# Patient Record
Sex: Female | Born: 1960 | Race: White | Hispanic: No | Marital: Married | Smoking: Never smoker
Health system: Southern US, Community
[De-identification: ages and names within clinical notes are randomized; demographics above are authoritative.]

## PROBLEM LIST (undated history)

## (undated) DIAGNOSIS — E782 Mixed hyperlipidemia: Secondary | ICD-10-CM

## (undated) DIAGNOSIS — I2510 Atherosclerotic heart disease of native coronary artery without angina pectoris: Secondary | ICD-10-CM

## (undated) DIAGNOSIS — R002 Palpitations: Secondary | ICD-10-CM

## (undated) DIAGNOSIS — G43909 Migraine, unspecified, not intractable, without status migrainosus: Secondary | ICD-10-CM

## (undated) DIAGNOSIS — I10 Essential (primary) hypertension: Secondary | ICD-10-CM

## (undated) DIAGNOSIS — M1990 Unspecified osteoarthritis, unspecified site: Secondary | ICD-10-CM

## (undated) DIAGNOSIS — K219 Gastro-esophageal reflux disease without esophagitis: Secondary | ICD-10-CM

## (undated) HISTORY — DX: Gastro-esophageal reflux disease without esophagitis: K21.9

## (undated) HISTORY — PX: GANGLION CYST EXCISION: SHX1691

## (undated) HISTORY — DX: Essential (primary) hypertension: I10

## (undated) HISTORY — PX: RECTOCELE REPAIR: SHX761

## (undated) HISTORY — DX: Palpitations: R00.2

## (undated) HISTORY — DX: Unspecified osteoarthritis, unspecified site: M19.90

## (undated) HISTORY — DX: Mixed hyperlipidemia: E78.2

---

## 1982-10-15 HISTORY — PX: DILATION AND CURETTAGE OF UTERUS: SHX78

## 1989-06-15 HISTORY — PX: CARPAL TUNNEL RELEASE: SHX101

## 1999-05-16 HISTORY — PX: TUBAL LIGATION: SHX77

## 2004-06-13 ENCOUNTER — Emergency Department (HOSPITAL_COMMUNITY): Admission: EM | Admit: 2004-06-13 | Discharge: 2004-06-13 | Payer: Self-pay | Admitting: Emergency Medicine

## 2004-11-21 ENCOUNTER — Emergency Department (HOSPITAL_COMMUNITY): Admission: EM | Admit: 2004-11-21 | Discharge: 2004-11-21 | Payer: Self-pay | Admitting: Emergency Medicine

## 2004-11-27 ENCOUNTER — Ambulatory Visit (HOSPITAL_COMMUNITY): Admission: RE | Admit: 2004-11-27 | Discharge: 2004-11-27 | Payer: Self-pay | Admitting: Emergency Medicine

## 2004-11-29 ENCOUNTER — Ambulatory Visit: Payer: Self-pay | Admitting: *Deleted

## 2007-03-26 ENCOUNTER — Other Ambulatory Visit: Admission: RE | Admit: 2007-03-26 | Discharge: 2007-03-26 | Payer: Self-pay | Admitting: Internal Medicine

## 2007-03-26 ENCOUNTER — Encounter (INDEPENDENT_AMBULATORY_CARE_PROVIDER_SITE_OTHER): Payer: Self-pay | Admitting: Internal Medicine

## 2007-03-27 ENCOUNTER — Ambulatory Visit (HOSPITAL_COMMUNITY): Admission: RE | Admit: 2007-03-27 | Discharge: 2007-03-27 | Payer: Self-pay | Admitting: Internal Medicine

## 2008-01-13 ENCOUNTER — Ambulatory Visit (HOSPITAL_COMMUNITY): Admission: RE | Admit: 2008-01-13 | Discharge: 2008-01-13 | Payer: Self-pay | Admitting: Internal Medicine

## 2008-03-23 ENCOUNTER — Ambulatory Visit (HOSPITAL_COMMUNITY): Admission: RE | Admit: 2008-03-23 | Discharge: 2008-03-23 | Payer: Self-pay | Admitting: Internal Medicine

## 2008-04-26 ENCOUNTER — Other Ambulatory Visit: Admission: RE | Admit: 2008-04-26 | Discharge: 2008-04-26 | Payer: Self-pay | Admitting: Obstetrics and Gynecology

## 2008-05-05 ENCOUNTER — Ambulatory Visit (HOSPITAL_COMMUNITY): Admission: RE | Admit: 2008-05-05 | Discharge: 2008-05-05 | Payer: Self-pay | Admitting: Internal Medicine

## 2008-05-05 ENCOUNTER — Encounter: Admission: RE | Admit: 2008-05-05 | Discharge: 2008-05-05 | Payer: Self-pay | Admitting: Neurosurgery

## 2008-05-17 ENCOUNTER — Ambulatory Visit (HOSPITAL_COMMUNITY): Admission: RE | Admit: 2008-05-17 | Discharge: 2008-05-17 | Payer: Self-pay | Admitting: Internal Medicine

## 2008-05-19 ENCOUNTER — Encounter: Admission: RE | Admit: 2008-05-19 | Discharge: 2008-05-19 | Payer: Self-pay | Admitting: Neurosurgery

## 2008-06-18 ENCOUNTER — Encounter: Admission: RE | Admit: 2008-06-18 | Discharge: 2008-06-18 | Payer: Self-pay | Admitting: Neurosurgery

## 2008-09-13 ENCOUNTER — Observation Stay (HOSPITAL_COMMUNITY): Admission: RE | Admit: 2008-09-13 | Discharge: 2008-09-14 | Payer: Self-pay | Admitting: Obstetrics and Gynecology

## 2008-11-18 ENCOUNTER — Ambulatory Visit (HOSPITAL_COMMUNITY): Admission: RE | Admit: 2008-11-18 | Discharge: 2008-11-18 | Payer: Self-pay | Admitting: Internal Medicine

## 2009-05-26 ENCOUNTER — Ambulatory Visit (HOSPITAL_COMMUNITY): Admission: RE | Admit: 2009-05-26 | Discharge: 2009-05-26 | Payer: Self-pay | Admitting: Internal Medicine

## 2009-10-27 ENCOUNTER — Ambulatory Visit (HOSPITAL_COMMUNITY): Admission: RE | Admit: 2009-10-27 | Discharge: 2009-10-27 | Payer: Self-pay | Admitting: Internal Medicine

## 2009-11-07 ENCOUNTER — Emergency Department (HOSPITAL_COMMUNITY): Admission: EM | Admit: 2009-11-07 | Discharge: 2009-11-07 | Payer: Self-pay | Admitting: Emergency Medicine

## 2010-03-01 IMAGING — CT CT HEAD W/O CM
1 series · 16 of 30 positions shown, 20 images · non-contrast
Comparison: None

CLINICAL DATA: Pain left side of head and left eye

CT HEAD WITHOUT CONTRAST
TECHNIQUE: Contiguous axial images were obtained from the base of
the skull through the vertex without contrast.

[Series 2: headseq 4.8 h37s · axial · 0.43mm/px · z∈[+90,+253]mm · 16 of 36 slices shown, 20 images]
[im 2/36  brain]
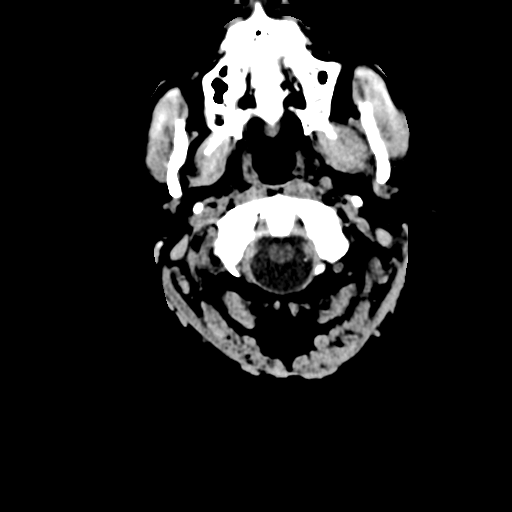
[im 2/36  bone]
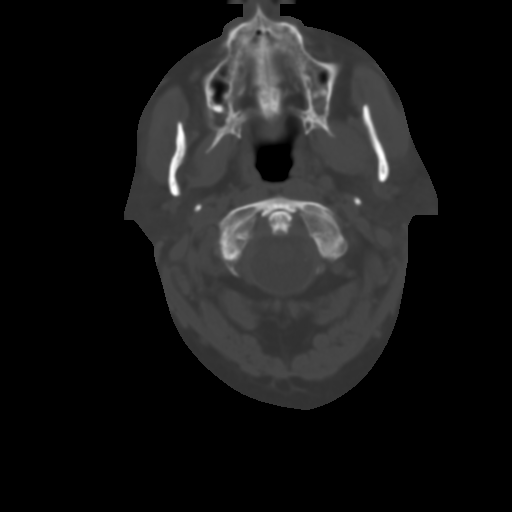
[im 4/36  brain]
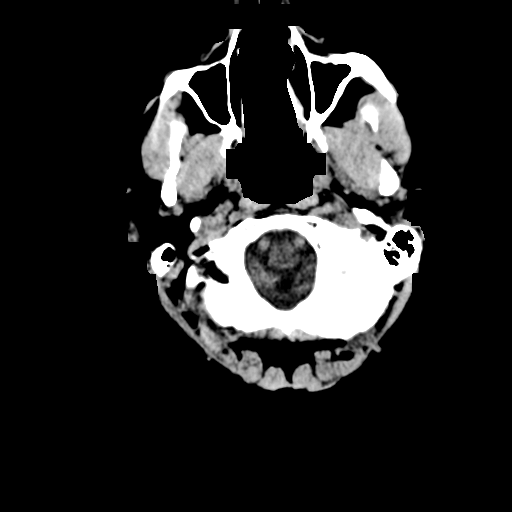
[im 7/36  brain]
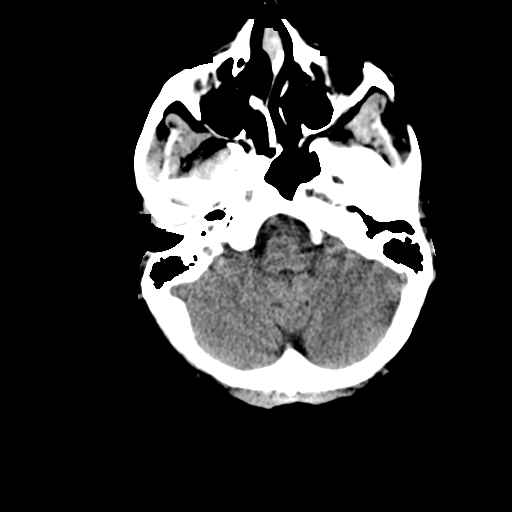
[im 9/36  brain]
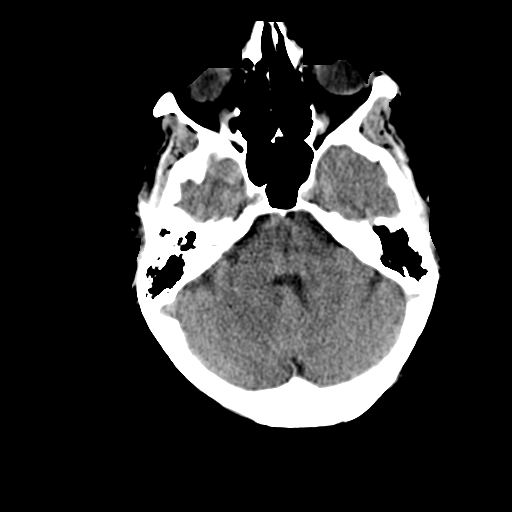
[im 10/36  brain]
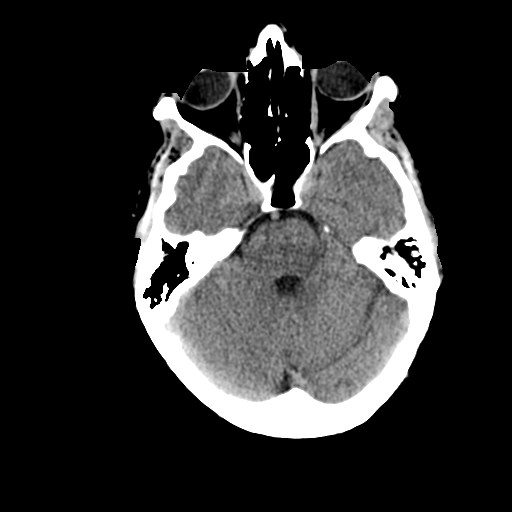
[im 10/36  bone]
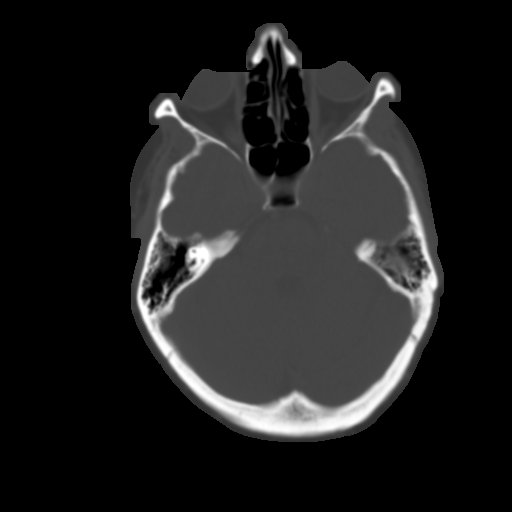
[im 13/36  brain]
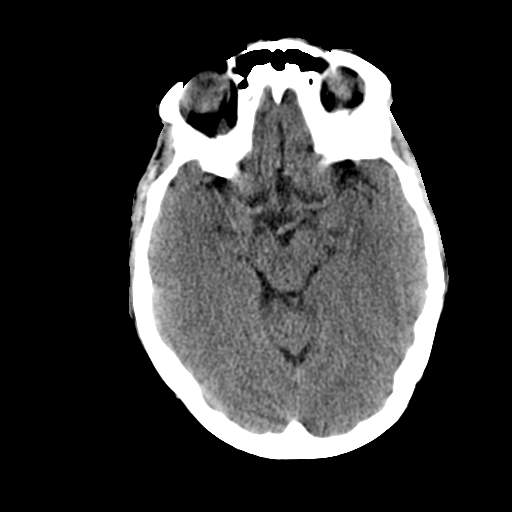
[im 15/36  brain]
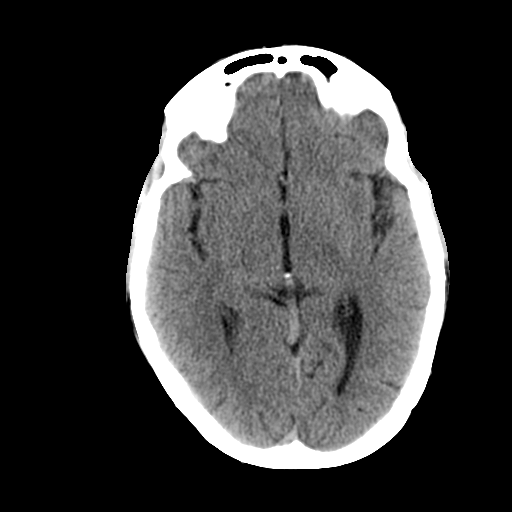
[im 17/36  brain]
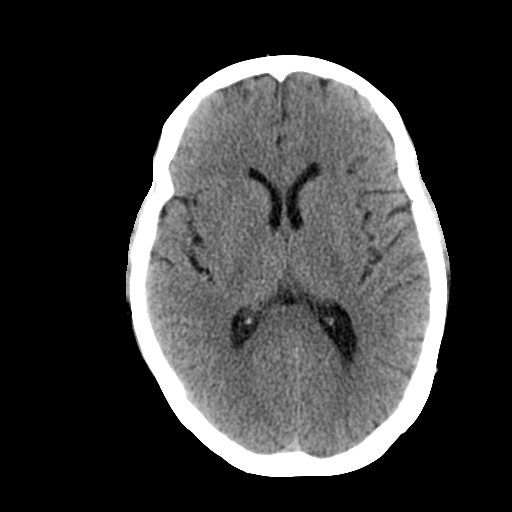
[im 19/36  brain]
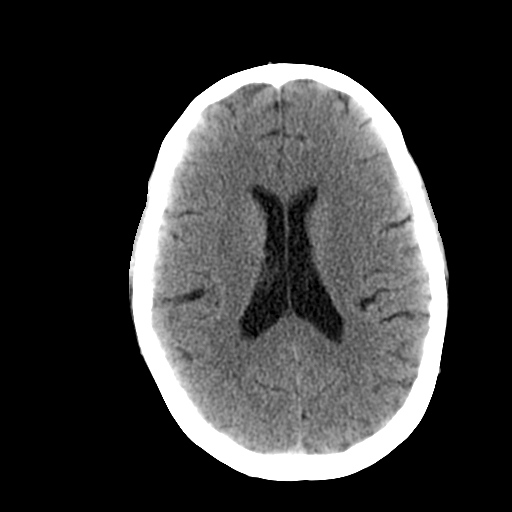
[im 19/36  bone]
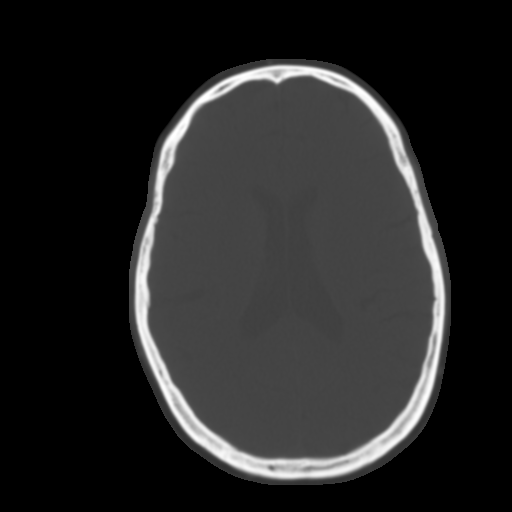
[im 21/36  brain]
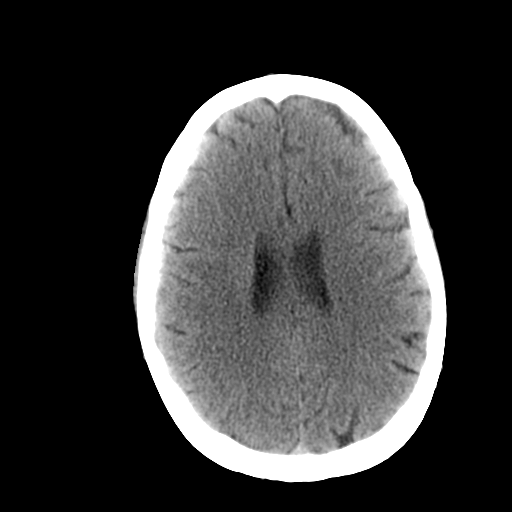
[im 23/36  brain]
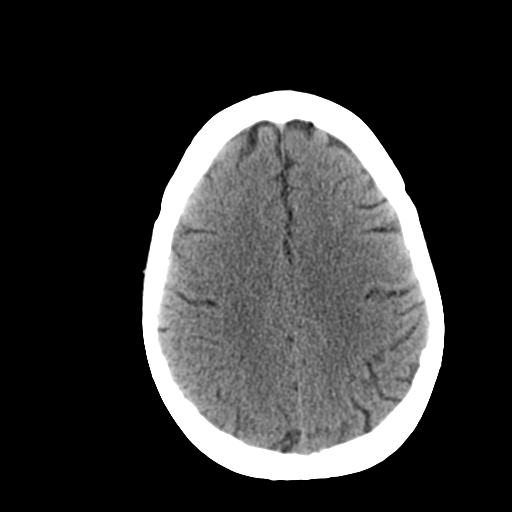
[im 26/36  brain]
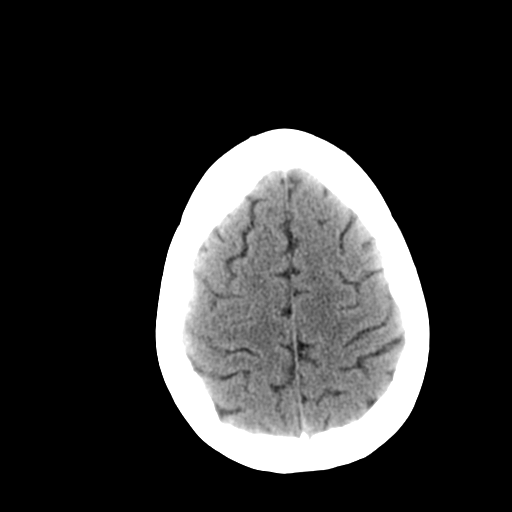
[im 27/36  brain]
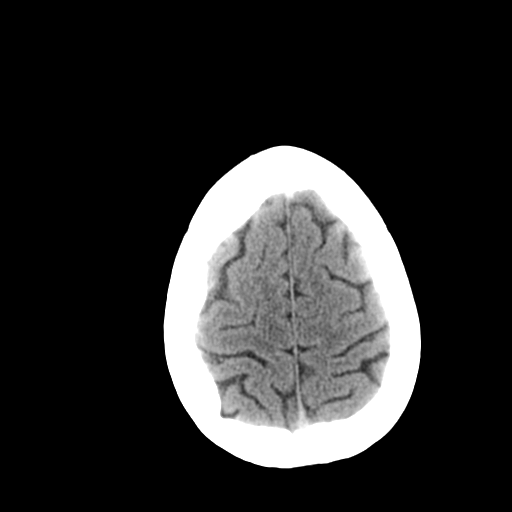
[im 27/36  bone]
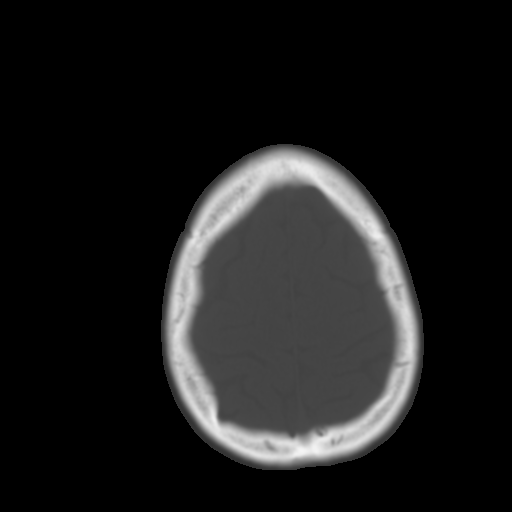
[im 29/36  brain]
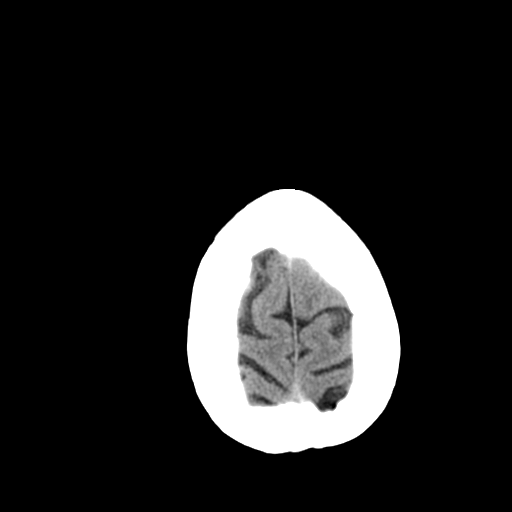
[im 32/36  brain]
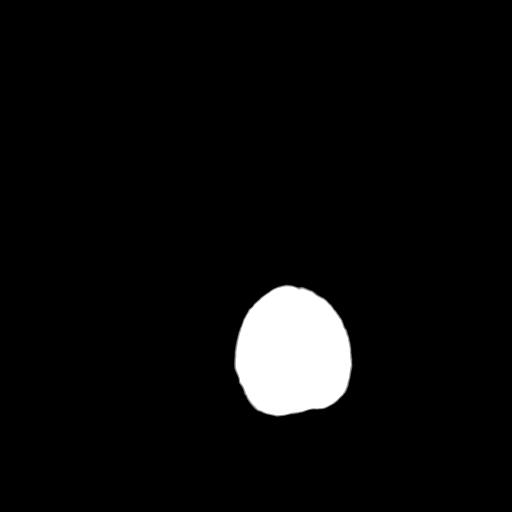
[im 34/36  brain]
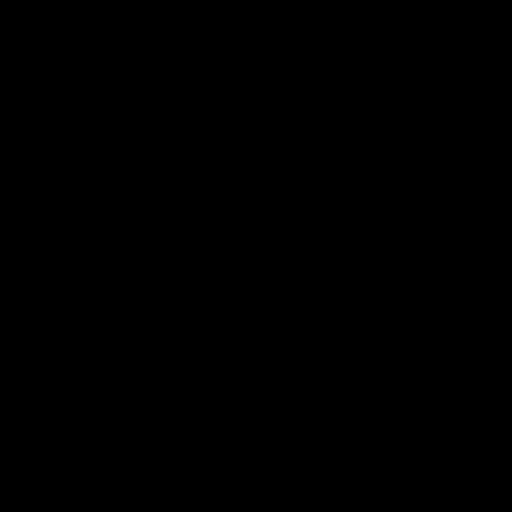

[16 of 30 positions shown; findings below may reference images not displayed]

FINDINGS: Normal ventricular morphology.
No midline shift or mass effect.
Normal appearance of brain parenchyma.
No intracranial hemorrhage, mass lesion, or acute infarction.
Visualized paranasal sinuses and mastoid air cells clear.
Bones unremarkable.
IMPRESSION: No acute intracranial abnormalities.

## 2010-07-03 ENCOUNTER — Ambulatory Visit (HOSPITAL_COMMUNITY): Admission: RE | Admit: 2010-07-03 | Discharge: 2010-07-03 | Payer: Self-pay | Admitting: Gastroenterology

## 2010-11-05 ENCOUNTER — Encounter: Payer: Self-pay | Admitting: Internal Medicine

## 2010-12-31 LAB — DIFFERENTIAL
Basophils Absolute: 0.1 10*3/uL (ref 0.0–0.1)
Monocytes Absolute: 0.8 10*3/uL (ref 0.1–1.0)
Monocytes Relative: 12 % (ref 3–12)

## 2010-12-31 LAB — URINALYSIS, ROUTINE W REFLEX MICROSCOPIC
Bilirubin Urine: NEGATIVE
Nitrite: NEGATIVE
Protein, ur: NEGATIVE mg/dL
Specific Gravity, Urine: 1.01 (ref 1.005–1.030)
Urobilinogen, UA: 0.2 mg/dL (ref 0.0–1.0)
pH: 7 (ref 5.0–8.0)

## 2010-12-31 LAB — CBC: MCHC: 34.4 g/dL (ref 30.0–36.0)

## 2010-12-31 LAB — PROTIME-INR
INR: 1.01 (ref 0.00–1.49)
Prothrombin Time: 13.2 seconds (ref 11.6–15.2)

## 2010-12-31 LAB — COMPREHENSIVE METABOLIC PANEL
ALT: 28 U/L (ref 0–35)
AST: 23 U/L (ref 0–37)
Albumin: 4.1 g/dL (ref 3.5–5.2)
Alkaline Phosphatase: 22 U/L — ABNORMAL LOW (ref 39–117)
CO2: 29 mEq/L (ref 19–32)
GFR calc Af Amer: >60 mL/min (ref >60)
GFR calc non Af Amer: >60 mL/min (ref >60)
Glucose, Bld: 106 mg/dL — ABNORMAL HIGH (ref 70–99)

## 2010-12-31 LAB — URINE CULTURE

## 2010-12-31 LAB — GLUCOSE, CAPILLARY: Glucose-Capillary: 90 mg/dL (ref 70–99)

## 2011-02-27 NOTE — Discharge Summary (Signed)
NAMEMARILI, [name] NO.:  [id number]   MEDICAL RECORD NO.:  [id number]          PATIENT TYPE:  OBV   LOCATION:  A310                          FACILITY:  [facility]   PHYSICIAN:  Tilda Burrow, M.D. DATE OF BIRTH:  06-06-1961   DATE OF ADMISSION:  09/13/2008  DATE OF DISCHARGE:  12/01/2009LH                               DISCHARGE SUMMARY   ADMITTING DIAGNOSES:  1. Symptomatic rectocele.  2. Chronic hypertension.  3. Rheumatoid arthritis.   DISCHARGE DIAGNOSES:  1. Symptomatic rectocele.  2. Chronic hypertension.  3. Rheumatoid arthritis.   PROCEDURE:  Posterior vaginal repair.   SURGEON:  Tilda Burrow, MD   DISCHARGE MEDICATIONS:  1. Atenolol 50 mg b.i.d. for hypertension.  2. Prednisone 5 mg t.i.d. for the rheumatoid arthritis.  3. Motrin 400 mg p.o. q.6 h. p.r.n. soreness.  4. Doxycycline 100 mg p.o. b.i.d. x7 days, prophylaxis.  5. Vicodin 5/500, 1-2 q.4 h. p.r.n. mild pain.  6. MiraLax 17 g p.o. daily until first bowel movement, then 4 times      weekly.   HOSPITAL SUMMARY:  This generally healthy 50 year old gravida 7, para 6,  status post tubal ligation, she was admitted with symptomatic rectocele.  Surgery was performed on the day of admission.  Bowel prep was then  performed the day before surgery.  The surgery was notable for very  weak, attenuated tissue in the rectovaginal septum, but fortunately with  some effort, we were able to find adequate paravaginal tissue to attach  the strong connecting tissue around the anal sphincter upward and  lateral to achieve satisfactory posterior repair.  The patient was kept  overnight with vaginal packing and Foley catheter.  These were removed  the following morning.  She was  discharged home.  She will also take MiraLax as a stool softener.  Followup will be in 1 week and then 4 weeks with me.  The patient is to  call for fever, increasing pain, tenderness, and to avoid sexual  activity until  specifically released from our care.      Tilda Burrow, M.D.  Electronically Signed     JVF/MEDQ  D:  09/14/2008  T:  09/15/2008  Job:  [id number]

## 2011-02-27 NOTE — Op Note (Signed)
NAMEPENNIE, VANBLARCOM NO.:  [id number]   MEDICAL RECORD NO.:  [id number]          PATIENT TYPE:  OBV   LOCATION:  A310                          FACILITY:  [facility]   PHYSICIAN:  Tilda Burrow, M.D. DATE OF BIRTH:  May 08, 1961   DATE OF PROCEDURE:  09/13/2008  DATE OF DISCHARGE:                               OPERATIVE REPORT   PREOPERATIVE DIAGNOSIS:  Rectocele.   POSTOPERATIVE DIAGNOSIS:  Rectocele.   PROCEDURE:  Posterior repair.   SURGEON:  Tilda Burrow, MD   ASSISTANT:  Marisa Severin, CST   ANESTHESIA:  General.   COMPLICATIONS:  None.   FINDINGS:  Tranverse defect, very loose tissue in the pouch of Douglas.  Good perineal tissue noted just about the center, able to be elevated  and attached to perirectal tissues bilaterally.   SPECIMENS:  None.   ESTIMATED BLOOD LOSS:  50 mL.   COUNTS:  Sponge and needle counts correct.   DETAILS OF PROCEDURE:  The patient was taken to the operating room,  prepped and draped for vaginal procedure after a bowel prep the night  before.  A time-out was conducted and preoperative antibiotics  administered.  The posterior vaginal mucosa was injected with Marcaine  with epinephrine x8 mL, and then split at the posterior fourchette just  inside the pigmentation line, and dissected off the underlying  connective tissue in the midline using a spreading technique.  The  vaginal epithelium was dissected halfway up to the posterior vaginal  wall and then dissected laterally.  Prior to initiating surgery, a  triple-gloved right index finger was placed in the rectum to establish  the anatomy.  Outside glove was discontinued immediately after this,  having done the exam underneath the vaginal __Bib___.   Once the lateral dissection was satisfactorily completed, rectal exam  was repeated and the index finger of the right hand was kept inside the  rectum during the remainder of the procedure to ensure protection in the  rectum.  Allis clamps were used to grasp lateral perirectal supportive  tissue on either side.  This was very attenuated particularly in the  right side, but enough could be found and a suture could be placed in  the left perirectal tissues down to the firm tissue of the perineal body  and tied in order to bring things upward and a similar suture placed on  the patient's right side.  The left suture was tied down  __and __caused  _an irregular contour as palpated through the rectum.  The left-sided  stitch was taken out and replaced with an improved result.  The anterior  support of the rectum was dramatically improved from this maneuver.  After tying down these 2 sutures, there is dramatic improvement in the  anterior rectal support as well as the upward elevation of the perineal  body itself.  Gloved hands removed from rectum, gloves changed and then  perineal body reconstructed (by further lateral dissection beneath the  vaginal epithelium underneath the __levator plate___by placing a series  of 4 interrupted vertical mattress sutures side-by-side fully above the  cavernosus muscles in closer  approximation in midline resulting in the  improved perineal support.  These sutures were tied down with improved  thickening of the perineal body.  Exam with left index finger in the  vagina showed that there was no restriction of the introitus.  After the  second layer of sutures, __0-Vicryl____we were able to trim the vaginal  epithelium and close the vaginal epithelium with 2-0 chromic.  Digital  rectal exam confirmed that there is no dimpling or irregular contour to  the rectum and the vaginal angle was considered satisfactory.  A vaginal  packing is placed and the entire 2-inch vaginal tape soaked in Betadine  was placed in the vagina with Foley catheter inserted revealing clear  urine.  Sponge and needle counts were correct.  The patient went to  recovery room in stable condition.       Tilda Burrow, M.D.  Electronically Signed     JVF/MEDQ  D:  09/13/2008  T:  09/14/2008  Job:  [id number]

## 2011-03-02 NOTE — H&P (Signed)
NAME:  Karen Roach, Karen Roach NO.:  [id number]   MEDICAL RECORD NO.:  [id number]          PATIENT TYPE:  AMB   LOCATION:  DAY                           FACILITY:  [facility]   PHYSICIAN:  Tilda Burrow, M.D. DATE OF BIRTH:  01/12/1961   DATE OF ADMISSION:  DATE OF DISCHARGE:  LH                              HISTORY & PHYSICAL   ADMITTING DIAGNOSES:  1. Rectocele, symptomatic.  2. [facility].  3. Chronic hypertension.  4. Rheumatoid arthritis.   HISTORY OF PRESENT ILLNESS:  This 50 year old female gravida 7, para 6,  AB 1, status post tubal ligation with LMP August 2009, who was admitted  for posterior repair.  She is admitted at [facility] for  posterior repair for symptomatic rectocele, which is causing the patient  trouble with bowel movements it seems like the bowel movement tries to  come out between the rectum and the vagina.  She also noticed some  loose bowel movements.  There is slow anal evacuation with repeated  straining and relaxing efforts necessary to effectively evacuate.  She  does not have any urinary incontinence and the uterine support is good,  so only posterior repair is intended.  She has noticed some decreased  sensation with intimacy but the perineal body is relatively firm below  the rectocele defect.  The patient understands the procedure, has been  reviewed with [facility] visual guides and medical explainer.  Copies of  photos given to patient for her study and questions answered.   PAST MEDICAL HISTORY:  Positive for hypertension managed in the [facility] in [place] as well as rheumatoid arthritis since age 50,  described as mild.   PAST SURGICAL HISTORY:  Ganglion cyst in 1975; carpal tunnel left in  1992; carpal tunnel right in 1993; tubal ligation in August 2000,  laparoscopically performed.  Injuries none.   ALLERGIES:  DEMEROL leads to rash when used postoperatively,  CIPROFLOXACIN causes rash.   MEDICATIONS:  1. Atenolol 50 mg b.i.d. for hypertension.  2. Prednisone 5 mg p.o. daily for rheumatoid arthritis.   Usual weight 195 to 200.  The patient denies any allergies and denies  any latex allergies or food allergies.   PHYSICAL EXAMINATION:  VITAL SIGNS: Height 5 feet, 6 inches, weight  201.8, and blood pressure 140/102.  HEENT:  Pupils equal, round, and reactive.  NECK:  Supple.  CHEST:  Clear to auscultation.  BREAST:  Deferred.  CARDIAC:  Regular rate and rhythm.  LUNGS:  Clear.  ABDOMEN:  Moderate obesity without masses.  EXTREMITIES: Grossly normal.  NEUROLOGIC:  Grossly intact.  Alert and oriented x3.  EXTERNAL GENITALIA:  Shows a snug introitus, flat perineal body with  protuberant mons pubis area.  No significant uterine descensus.  The  bladder is well supported anteriorly.  The adnexa were without masses.  RECTAL:  Digital rectal exam shows an intact anal sphincter and a  transverse defect above the anal sphincter and adjacent tissues with an  indistinct upper aspect of the rectocele defect.   IMPRESSION:  1. Symptomatic rectocele.  2. Chronic steroid use secondary to  rheumatoid arthritis.   PLAN:  Posterior repair through [facility]  Plan.  Note, we will initially attempt the procedure without use of mesh  devices.  The patient does not have high degree of steroid use but she  understands that the failure rate is greater with people on chronic  steroid suppression.   ADDENDUM:  Contacts:  Husband [phone] or daughter Levonne Spiller on  [phone]      Tilda Burrow, M.D.     JVF/MEDQ  D:  08/26/2008  T:  08/27/2008  Job:  [id number]

## 2011-03-02 NOTE — H&P (Signed)
NAME:  Karen Roach, Karen Roach NO.:  [id number]   MEDICAL RECORD NO.:  [id number]          PATIENT TYPE:  AMB   LOCATION:  DAY                           FACILITY:  [facility]   PHYSICIAN:  Tilda Burrow, M.D. DATE OF BIRTH:  07-03-1961   DATE OF ADMISSION:  DATE OF DISCHARGE:  LH                              HISTORY & PHYSICAL   ADMITTING DIAGNOSES:  1. Rectocele, symptomatic.  2. [facility] ref[facility].  3. Chronic hypertension.  4. Rheumatoid arthritis.   HISTORY OF PRESENT ILLNESS:  This 50 year old female gravida 7, para 6,  AB 1, status post tubal ligation with LMP August 2009, who was admitted  for posterior repair.  She is admitted at [facility] for  posterior repair for symptomatic rectocele, which is causing the patient  trouble with bowel movements it seems like the bowel movement tries to  come out between the rectum and the vagina.  She also noticed some  loose bowel movements.  There is slow anal evacuation with repeated  straining and relaxing efforts necessary to effectively evacuate.  She  does not have any urinary incontinence and the uterine support is good,  so only posterior repair is intended.  She has noticed some decreased  sensation with intimacy but the perineal body is relatively firm below  the rectocele defect.  The patient understands the procedure, has been  reviewed with [facility] visual guides and medical explainer.  Copies of  photos given to patient for her study and questions answered.   PAST MEDICAL HISTORY:  Positive for hypertension managed in the [facility] in [place] as well as rheumatoid arthritis since age 89,  described as mild.   PAST SURGICAL HISTORY:  Ganglion cyst in 1975; carpal tunnel left in  1992; carpal tunnel right in 1993; tubal ligation in August 2000,  laparoscopically performed.  Injuries none.   ALLERGIES:  DEMEROL leads to rash when used postoperatively,  CIPROFLOXACIN causes rash.   MEDICATIONS:  1. Atenolol 50 mg b.i.d. for hypertension.  2. Prednisone 5 mg p.o. daily for rheumatoid arthritis.   Usual weight 195 to 200.  The patient denies any latex allergies or food  allergies.   PHYSICAL EXAMINATION:  VITAL SIGNS: Height 5 feet, 6 inches, weight  201.8, and blood pressure 140/102.  HEENT:  Pupils equal, round, and reactive.  NECK:  Supple.  CHEST:  Clear to auscultation.  BREAST:  Deferred.  CARDIAC:  Regular rate and rhythm.  LUNGS:  Clear.  ABDOMEN:  Moderate obesity without masses.  EXTREMITIES: Grossly normal.  NEUROLOGIC:  Grossly intact.  Alert and oriented x3.  EXTERNAL GENITALIA:  Shows a snug introitus, flat perineal body with  protuberant mons pubis area.  No significant uterine descensus.  The  bladder is well supported anteriorly.  The adnexa were without masses.  RECTAL:  Digital rectal exam shows an intact anal sphincter and a  transverse defect above the anal sphincter and adjacent tissues with an  indistinct upper aspect of the rectocele defect.   IMPRESSION:  1. Symptomatic rectocele.  2. Chronic steroid use secondary to rheumatoid arthritis.  PLAN:  Posterior repair through [facility]  Plan.  Note, we will initially attempt the procedure without use of mesh  devices.  The patient does not have high degree of steroid use but she  understands that the failure rate is greater with people on chronic  steroid suppression.   ADDENDUM:  Contacts:  Husband [phone] or daughter Levonne Spiller on  [phone]      Tilda Burrow, M.D.  Electronically Signed     JVF/MEDQ  D:  08/26/2008  T:  08/27/2008  Job:  [id number]

## 2011-07-17 LAB — CBC
MCV: 88.3 fL (ref 78.0–100.0)
Platelets: 219 10*3/uL (ref 150–400)
WBC: 5.8 10*3/uL (ref 4.0–10.5)

## 2011-07-17 LAB — BASIC METABOLIC PANEL
BUN: 13 mg/dL (ref 6–23)
Calcium: 8.4 mg/dL (ref 8.4–10.5)
Chloride: 105 mEq/L (ref 96–112)
Creatinine, Ser: 0.59 mg/dL (ref 0.4–1.2)
GFR calc non Af Amer: >60 mL/min (ref >60)

## 2011-07-17 LAB — HCG, QUANTITATIVE, PREGNANCY: hCG, Beta Chain, Quant, S: <2 m[IU]/mL (ref <5)

## 2011-07-20 LAB — DIFFERENTIAL
Basophils Absolute: 0 10*3/uL (ref 0.0–0.1)
Lymphocytes Relative: 11 % — ABNORMAL LOW (ref 12–46)
Monocytes Absolute: 0.8 10*3/uL (ref 0.1–1.0)
Neutro Abs: 10.4 10*3/uL — ABNORMAL HIGH (ref 1.7–7.7)

## 2011-07-20 LAB — CBC
Hemoglobin: 11.9 g/dL — ABNORMAL LOW (ref 12.0–15.0)
RDW: 12.7 % (ref 11.5–15.5)

## 2011-12-27 ENCOUNTER — Other Ambulatory Visit (HOSPITAL_COMMUNITY): Payer: Self-pay | Admitting: Physician Assistant

## 2011-12-27 DIAGNOSIS — Z1231 Encounter for screening mammogram for malignant neoplasm of breast: Secondary | ICD-10-CM

## 2012-01-14 ENCOUNTER — Ambulatory Visit (HOSPITAL_COMMUNITY)
Admission: RE | Admit: 2012-01-14 | Discharge: 2012-01-14 | Disposition: A | Discharge status: Home / Self Care | Payer: Self-pay | Source: Ambulatory Visit | Attending: Physician Assistant | Admitting: Physician Assistant

## 2012-01-14 DIAGNOSIS — Z1231 Encounter for screening mammogram for malignant neoplasm of breast: Secondary | ICD-10-CM

## 2012-10-02 ENCOUNTER — Ambulatory Visit (HOSPITAL_COMMUNITY)
Admission: RE | Admit: 2012-10-02 | Discharge: 2012-10-02 | Disposition: A | Discharge status: Home / Self Care | Payer: Self-pay | Source: Ambulatory Visit | Attending: Rheumatology | Admitting: Rheumatology

## 2012-10-02 DIAGNOSIS — M25519 Pain in unspecified shoulder: Secondary | ICD-10-CM | POA: Insufficient documentation

## 2012-10-02 DIAGNOSIS — IMO0001 Reserved for inherently not codable concepts without codable children: Secondary | ICD-10-CM | POA: Insufficient documentation

## 2012-10-02 DIAGNOSIS — M6281 Muscle weakness (generalized): Secondary | ICD-10-CM | POA: Insufficient documentation

## 2012-10-02 DIAGNOSIS — M7581 Other shoulder lesions, right shoulder: Secondary | ICD-10-CM | POA: Insufficient documentation

## 2012-10-02 NOTE — Evaluation (Signed)
Occupational Therapy Evaluation  Patient Details  Name: Karen Roach MRN: [id number] Date of Birth: 26-Oct-1960  Today's Date: 10/02/2012 Time: 1515-1600 OT Time Calculation (min): 45 min OT Eval 15' Manual Therapy 20' Visit#: 1  of 12   Re-eval: 10/30/12  Assessment Diagnosis: Right Shoulder Tendonitis Next MD Visit: January  Authorization: n/a  Authorization Time Period:    Authorization Visit#:   of     Past Medical History: No past medical history on file. Past Surgical History: No past surgical history on file.  Subjective S:  My right shoulder has been hurting for a few months.  Ive had 2 cortisone injections and can move it much better now. Pertinent History: Karen Roach states that she began experiencing increased pain and decreased mobility in her right shoulder several months ago.  She consulted with Dr. Kellie Simmering and received 2 cortisone injections.  She now has greater mobilty and less pain, however, is not able to complete all of her daily activities as well as she would like. 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 board Leisure: Hobbies-yes (Comment) Comments: She enjoys basketball, baseball, swimming, tennis, carpentry work  Assessment ADL/Vision/Perception ADL ADL Comments: Vacuuming, wiping windows, reaching into overhead cabinets, sleeping on her right arm are all painful  activities Dominant Hand: Right  Cognition/Observation Cognition Overall Cognitive Status: Appears within functional limits for tasks assessed  Sensation/Coordination/Edema Sensation Light Touch: Appears Intact  Additional Assessments RUE AROM (degrees) RUE Overall AROM Comments: assessed in seated, ER/IR with 90 abd Right Shoulder Flexion: 140 Degrees Right Shoulder ABduction: 100 Degrees Right Shoulder Internal Rotation: 60 Degrees Right Shoulder External Rotation: 80 Degrees RUE Strength Right Shoulder Flexion: 5/5 Right Shoulder ABduction:  (4-/5) Right Shoulder Internal Rotation:  (4+/5) Right Shoulder External Rotation:  (4-/5) Palpation Palpation: moderate fascial restrictions in her right scapular and trapezius region     Exercise/Treatments    Manual Therapy Manual Therapy: Myofascial release Myofascial Release: MFR and manual stretching to right upper arm, anterior shoulder, scapular region, trapezius region to decrease pain and restrictions and increase pain free mobility in her right shoulder region.   Occupational Therapy Assessment and Plan OT Assessment and Plan Clinical Impression Statement: A:  Patient presents with decreased mobility and strength and increased pain and restrictions in her right shoulder region causing decreased I with desired daily, work, and leisure activities. Pt will benefit from skilled therapeutic intervention in order to improve on the following deficits: Decreased range of motion;Decreased strength;Increased muscle spasms;Increased fascial restricitons;Pain Rehab Potential: Good OT Frequency: Min 2X/week OT Duration: 6 weeks OT Treatment/Interventions: Self-care/ADL training;Therapeutic exercise;Manual therapy;Modalities;Therapeutic activities;Patient/family education OT Plan: P:  Skilled OT intervention to decrease pain and restrictions in her right shoulder and increase pain free mobility in her right shoulder in order to return to  all desired daily activities. Treatment Plan:  MFR and manual stretching to right shoulder region, scapular region, anterior shoulder.  Therapeutic Exercises:  AAROM progressing to AROM in supine.  Seated scapular stability exercises, thumb  tacks, prot/ret//elev/dep, ball stretches, progress as tolerated.     Goals Short Term Goals Time to Complete Short Term Goals: 3 weeks Short Term Goal 1: Patient will be educated on HEP. Short Term Goal 2: Patient will increase AROM by 10 in her right shoulder for increased ability to reach into overhead cabinet. Short Term Goal 3: Patient will increase right shoulder strength to 4/5 for increased ability to lift sports equipment when playing with her son. Short Term Goal 4: Patient will decrease fascial restrictions in her right shoulder region.   Short Term Goal 5: Patient will decrease pain to 2/10 in her right shoulder when erasing the board at school. Long Term Goals Time to Complete Long Term Goals: 6 weeks Long Term Goal 1: Patient will return to prior level of I with all B/IADL, work, and leisure activities. Long Term Goal 2: Patient will increase right shoulder AROM to WNL for increased ability to participate in sports with her son. Long Term Goal 3: Patient will increase right shoulder strength to 5/5 for increased ability to lift plates into overhead cabinets. Long Term Goal 4: Patient will decrease pain in her right shoulder to 1/10 when erasing the board at school. Long Term Goal 5: Patient will decrease fascial restrictions to trace in her right shoulder.    Problem List Patient Active Problem List  Diagnosis  . Pain in joint, shoulder region  . Muscle weakness (generalized)  . Tendonitis of shoulder, right    End of Session Activity Tolerance: Patient tolerated treatment well General Behavior During Session: [facility] for tasks performed Cognition: [facility] for tasks performed OT Plan of Care OT Home Exercise Plan: dowel in supine and shoulder  stretches. Consulted and Agree with Plan of Care: Patient   Shirlean Mylar, OTR/L  10/02/2012, 4:40 PM  Physician Documentation Your signature is required to indicate approval of the treatment plan as stated above.  Please sign and either send electronically or make a copy of this report for your files and return this physician signed original.  Please mark one 1.__approve of plan  2. ___approve of plan with the following conditions.   ______________________________                                                          _____________________ Physician Signature                                                                                                             Date

## 2012-10-16 ENCOUNTER — Ambulatory Visit (HOSPITAL_COMMUNITY)
Admission: RE | Admit: 2012-10-16 | Discharge: 2012-10-16 | Disposition: A | Discharge status: Home / Self Care | Payer: Self-pay | Source: Ambulatory Visit | Attending: Gastroenterology | Admitting: Gastroenterology

## 2012-10-16 DIAGNOSIS — M6281 Muscle weakness (generalized): Secondary | ICD-10-CM | POA: Insufficient documentation

## 2012-10-16 DIAGNOSIS — IMO0001 Reserved for inherently not codable concepts without codable children: Secondary | ICD-10-CM | POA: Insufficient documentation

## 2012-10-16 DIAGNOSIS — M25519 Pain in unspecified shoulder: Secondary | ICD-10-CM | POA: Insufficient documentation

## 2012-10-16 NOTE — Progress Notes (Signed)
Occupational Therapy Treatment Patient Details  Name: Karen Roach MRN: [id number] Date of Birth: 19-Jun-1961  Today's Date: 10/16/2012 Time: 9629-5284 OT Time Calculation (min): 37 min Manual Therapy [phone] 24' Therapeutic Exercise 918-930 12' Visit#: 2  of 12   Re-eval: 10/30/12 Assessment Diagnosis: Right Shoulder Tendonitis  Authorization: n/a  Authorization Time Period:    Authorization Visit#:   of    Subjective Symptoms/Limitations Symptoms: S:  My shoulder is killing me, it ached all night.  The cortisone is wearing off. Pain Assessment Currently in Pain?: Yes Pain Score:   4 Pain Location: Shoulder Pain Orientation: Right Pain Type: Acute pain  Precautions/Restrictions  Precautions Precautions: None  Exercise/Treatments Supine Protraction: PROM;AAROM;10 reps Horizontal ABduction: PROM;AAROM;10 reps External Rotation: PROM;AAROM;10 reps Internal Rotation: PROM;AAROM;10 reps Flexion: PROM;AAROM;10 reps ABduction: PROM;AAROM;10 reps   Therapy Ball Flexion: 15 reps ABduction: 15 reps     Manual Therapy Manual Therapy: Myofascial release Myofascial Release: MFR and manual stretching to right upper arm, anterior shoulder, scapular region, trapezius region to decrease pain and restrictions and increase pain free mobility in her right shoulder region.   Occupational Therapy Assessment and Plan OT Assessment and Plan Clinical Impression Statement: A:  Added multiple new exercises, AAROM in supine, ball stretches, scapular mobility.  Education and cues to keep shoulder depressed and retracted and to ice to control pain. OT Plan: P  Add protraction/ret/elev/dep and thumbtacks.   Goals Short Term Goals Time to Complete Short Term Goals: 3 weeks Short Term Goal 1: Patient will be educated on HEP. Short Term Goal 2: Patient will increase AROM by 10 in her right shoulder for increased ability to reach into overhead cabinet. Short Term Goal 3: Patient will  increase right shoulder strength to 4/5 for increased ability to lift sports equipment when playing with her son. Short Term Goal 4: Patient will decrease fascial restrictions in her right shoulder region.   Short Term Goal 5: Patient will decrease pain to 2/10 in her right shoulder when erasing the board at school. Long Term Goals Time to Complete Long Term Goals: 6 weeks Long Term Goal 1: Patient will return to prior level of I with all B/IADL, work, and leisure activities. Long Term Goal 2: Patient will increase right shoulder AROM to WNL for increased ability to participate in sports with her son. Long Term Goal 3: Patient will increase right shoulder strength to 5/5 for increased ability to lift plates into overhead cabinets. Long Term Goal 4: Patient will decrease pain in her right shoulder to 1/10 when erasing the board at school. Long Term Goal 5: Patient will decrease fascial restrictions to trace in her right shoulder.    Problem List Patient Active Problem List  Diagnosis  . Pain in joint, shoulder region  . Muscle weakness (generalized)  . Tendonitis of shoulder, right    End of Session Activity Tolerance: Patient tolerated treatment well General Behavior During Session: [facility] for tasks performed Cognition: [facility] for tasks performed  GO    Noralee Stain, Lulia Schriner L 10/16/2012, 9:30 AM

## 2012-10-21 ENCOUNTER — Ambulatory Visit (HOSPITAL_COMMUNITY)
Admission: RE | Admit: 2012-10-21 | Discharge: 2012-10-21 | Disposition: A | Discharge status: Home / Self Care | Payer: Self-pay | Source: Ambulatory Visit | Attending: Rheumatology | Admitting: Rheumatology

## 2012-10-21 DIAGNOSIS — M25519 Pain in unspecified shoulder: Secondary | ICD-10-CM

## 2012-10-21 DIAGNOSIS — M6281 Muscle weakness (generalized): Secondary | ICD-10-CM

## 2012-10-21 NOTE — Progress Notes (Signed)
Occupational Therapy Treatment Patient Details  Name: Karen Roach MRN: [id number] Date of Birth: [birth year]  Today's Date: 10/21/2012 Time: 4098-1191 OT Time Calculation (min): 32 min Manual Therapy [phone] 17' Therapeutic Exercise 420-434 14'  Visit#: 3  of 12   Re-eval: 10/30/12 Assessment Diagnosis: Right Shoulder Tendonitis  Authorization: n/a  Authorization Time Period:    Authorization Visit#:   of    Subjective Symptoms/Limitations Symptoms: S:  I am a litle sore but I can tell it is moving better. Pain Assessment Currently in Pain?: Yes Pain Score:   2  Precautions/Restrictions  Precautions Precautions: None  Exercise/Treatments Supine Protraction: PROM;AAROM;12 reps Horizontal ABduction: PROM;AAROM;12 reps External Rotation: PROM;AAROM;12 reps Internal Rotation: PROM;AAROM;12 reps Flexion: PROM;AAROM;12 reps ABduction: PROM;AAROM;12 reps Seated Elevation: AROM;10 reps Extension: AROM;10 reps Row: AROM;10 reps   Therapy Ball Flexion: 25 reps ABduction: 25 reps ROM / Strengthening / Isometric Strengthening Thumb Tacks: 1 Prot/Ret//Elev/Dep: 1        Manual Therapy Manual Therapy: Myofascial release Myofascial Release: MFR and manual stretching to right upper arm, anterior shoulder, scapular region, trapezius region to decrease pain and restrictions and increase pain free mobility in her right shoulder region  Occupational Therapy Assessment and Plan OT Assessment and Plan Clinical Impression Statement: A: Added prot/ret/elev and depression and thumbtacks.  Patient's need for cueing much decreased today.  PROM  WNL. Rehab Potential: Good OT Plan: P:  Change from supine AAROM to AROM secondary to patient having full PROM.   Goals Short Term Goals Time to Complete Short Term Goals: 3 weeks Short Term Goal 1: Patient will be educated on HEP. Short Term Goal 2: Patient will increase AROM by 10 in her right shoulder for increased ability to  reach into overhead cabinet. Short Term Goal 3: Patient will increase right shoulder strength to 4/5 for increased ability to lift sports equipment when playing with her son. Short Term Goal 4: Patient will decrease fascial restrictions in her right shoulder region.   Short Term Goal 5: Patient will decrease pain to 2/10 in her right shoulder when erasing the board at school. Long Term Goals Time to Complete Long Term Goals: 6 weeks Long Term Goal 1: Patient will return to prior level of I with all B/IADL, work, and leisure activities. Long Term Goal 2: Patient will increase right shoulder AROM to WNL for increased ability to participate in sports with her son. Long Term Goal 3: Patient will increase right shoulder strength to 5/5 for increased ability to lift plates into overhead cabinets. Long Term Goal 4: Patient will decrease pain in her right shoulder to 1/10 when erasing the board at school. Long Term Goal 5: Patient will decrease fascial restrictions to trace in her right shoulder.    Problem List Patient Active Problem List  Diagnosis  . Pain in joint, shoulder region  . Muscle weakness (generalized)  . Tendonitis of shoulder, right    End of Session Activity Tolerance: Patient tolerated treatment well General Behavior During Session: [facility] for tasks performed Cognition: [facility] for tasks performed  GO    Noralee Stain, Carly Applegate L 10/21/2012, 5:17 PM

## 2012-10-23 ENCOUNTER — Ambulatory Visit (HOSPITAL_COMMUNITY): Payer: Self-pay | Admitting: Specialist

## 2012-10-28 ENCOUNTER — Ambulatory Visit (HOSPITAL_COMMUNITY)
Admission: RE | Admit: 2012-10-28 | Discharge: 2012-10-28 | Disposition: A | Discharge status: Home / Self Care | Payer: Self-pay | Source: Ambulatory Visit | Attending: Rheumatology | Admitting: Rheumatology

## 2012-10-28 DIAGNOSIS — M25519 Pain in unspecified shoulder: Secondary | ICD-10-CM

## 2012-10-28 DIAGNOSIS — M6281 Muscle weakness (generalized): Secondary | ICD-10-CM

## 2012-10-28 NOTE — Progress Notes (Signed)
Occupational Therapy Treatment Patient Details  Name: Karen Roach MRN: [id number] Date of Birth: 30-May-1961  Today's Date: 10/28/2012 Time: 6213-0865 OT Time Calculation (min): 31 min Manual Therapy [phone] 11' Therapeutic Exercise [phone] 25'  Visit#: 4  of 12   Re-eval: 10/30/12 Assessment Diagnosis: Right Shoulder Tendonitis  Authorization: n/a  Authorization Time Period:    Authorization Visit#:   of    Subjective Symptoms/Limitations Symptoms: S:  I'm feeling pretty good today Pain Assessment Currently in Pain?: Yes Pain Score:   2 Pain Location: Shoulder Pain Orientation: Right Pain Type: Acute pain  Precautions/Restrictions  Precautions Precautions: None  Exercise/Treatments Supine Protraction: PROM;AROM;10 reps Horizontal ABduction: PROM;AROM;10 reps External Rotation: PROM;AROM;10 reps Internal Rotation: PROM;AROM;10 reps Flexion: PROM;AROM;10 reps ABduction: PROM;AROM;10 reps Seated Elevation: Other (comment) (switched to tband) Standing External Rotation: Theraband;10 reps Theraband Level (Shoulder External Rotation): Level 2 (Red) Internal Rotation: Theraband;10 reps Theraband Level (Shoulder Internal Rotation): Level 2 (Red) Extension: Theraband;10 reps Theraband Level (Shoulder Extension): Level 2 (Red) Row: Theraband;10 reps Theraband Level (Shoulder Row): Level 2 (Red) Retraction: Theraband;10 reps Theraband Level (Shoulder Retraction): Level 2 (Red)  Therapy Ball Flexion: 25 reps ABduction: 25 reps Right/Left: 5 reps ROM / Strengthening / Isometric Strengthening Wall Wash: 2' Thumb Tacks: 1 Prot/Ret//Elev/Dep: 1       Manual Therapy Manual Therapy: Myofascial release Myofascial Release: MFR and manual stretching to right upper arm, anterior shoulder, scapular region, trapezius region to decrease pain and restrictions and increase pain free mobility in her right shoulder region   Occupational Therapy Assessment and Plan OT  Assessment and Plan Clinical Impression Statement: A:  Switched from AAROM supine to AROM secondary to pt has WNL PROM fro all except ER which is still about 50% limited.  Also added ball circles and wall wash to increase AROM. OT Plan: P:  Reassess   Goals Short Term Goals Time to Complete Short Term Goals: 3 weeks Short Term Goal 1: Patient will be educated on HEP. Short Term Goal 2: Patient will increase AROM by 10 in her right shoulder for increased ability to reach into overhead cabinet. Short Term Goal 3: Patient will increase right shoulder strength to 4/5 for increased ability to lift sports equipment when playing with her son. Short Term Goal 4: Patient will decrease fascial restrictions in her right shoulder region.   Short Term Goal 5: Patient will decrease pain to 2/10 in her right shoulder when erasing the board at school. Long Term Goals Time to Complete Long Term Goals: 6 weeks Long Term Goal 1: Patient will return to prior level of I with all B/IADL, work, and leisure activities. Long Term Goal 2: Patient will increase right shoulder AROM to WNL for increased ability to participate in sports with her son. Long Term Goal 3: Patient will increase right shoulder strength to 5/5 for increased ability to lift plates into overhead cabinets. Long Term Goal 4: Patient will decrease pain in her right shoulder to 1/10 when erasing the board at school. Long Term Goal 5: Patient will decrease fascial restrictions to trace in her right shoulder.    Problem List Patient Active Problem List  Diagnosis  . Pain in joint, shoulder region  . Muscle weakness (generalized)  . Tendonitis of shoulder, right    End of Session Activity Tolerance: Patient tolerated treatment well General Behavior During Session: [facility] for tasks performed Cognition: [facility] for tasks performed  GO    Noralee Stain, Preston Weill L 10/28/2012, 4:57 PM

## 2012-10-30 ENCOUNTER — Inpatient Hospital Stay (HOSPITAL_COMMUNITY): Admission: RE | Admit: 2012-10-30 | Payer: Self-pay | Source: Ambulatory Visit | Admitting: Specialist

## 2012-11-04 ENCOUNTER — Ambulatory Visit (HOSPITAL_COMMUNITY)
Admission: RE | Admit: 2012-11-04 | Discharge: 2012-11-04 | Disposition: A | Discharge status: Home / Self Care | Payer: Self-pay | Source: Ambulatory Visit | Attending: Rheumatology | Admitting: Rheumatology

## 2012-11-04 DIAGNOSIS — M25519 Pain in unspecified shoulder: Secondary | ICD-10-CM

## 2012-11-04 DIAGNOSIS — M6281 Muscle weakness (generalized): Secondary | ICD-10-CM

## 2012-11-04 NOTE — Progress Notes (Signed)
Occupational Therapy Treatment Patient Details  Name: SHANDRIA CLINCH MRN: [id number] Date of Birth: 14-Oct-1961  Today's Date: 11/04/2012 Time: 1610-1650 OT Time Calculation (min): 40 min Manual Therapy 4098-1191 13' Reassess 4782-9562 8' Therapeutic Exercises [phone] 22' Visit#: 5  of 12   Re-eval: 12/02/12    Authorization: n/a  Authorization Time Period:    Authorization Visit#:   of    Subjective Symptoms/Limitations Symptoms: S:  I can put my deoderant on better, but its still difficult to reach behind my head. Special Tests: DASH scored 11.36 with 0 being ideal score. Pain Assessment Currently in Pain?: Yes Pain Score:   2 Pain Location: Shoulder Pain Orientation: Right Pain Type: Acute pain  Precautions/Restrictions     Exercise/Treatments Supine Protraction: PROM;10 reps Horizontal ABduction: PROM;10 reps External Rotation: PROM;10 reps Internal Rotation: PROM;10 reps Flexion: PROM;10 reps ABduction: PROM;10 reps Seated Extension: Strengthening;10 reps Extension Weight (lbs): 1 Retraction: Strengthening;10 reps Retraction Weight (lbs): 1 Row: Strengthening;10 reps Row Weight (lbs): 1 Protraction: Strengthening;10 reps Protraction Weight (lbs): 1 Horizontal ABduction: Strengthening;10 reps Horizontal ABduction Weight (lbs): 1 External Rotation: Strengthening;10 reps External Rotation Weight (lbs): 1 Internal Rotation: Strengthening;10 reps Internal Rotation Weight (lbs): 1 Flexion: Strengthening;10 reps Flexion Weight (lbs): 1 Abduction: Strengthening;10 reps ABduction Weight (lbs): 1 Therapy Ball Flexion:  (dc) ABduction:  (dc) Right/Left:  (dc) ROM / Strengthening / Isometric Strengthening UBE (Upper Arm Bike): begin next visit  Wall Wash: 2' with 1# Thumb Tacks: dc "W" Arms: 10 reps with 1# X to V Arms: 10 reps with 1# Proximal Shoulder Strengthening, Seated: 10 reps with 1# without restbreaks Prot/Ret//Elev/Dep: dc      Manual  Therapy Manual Therapy: Myofascial release Myofascial Release: MFR and manual stretching to right upper arm, anterior shoulder, scapular region, trapezius region to decrease pain and restrictions and increase pain free mobility in her right shoulder region   Occupational Therapy Assessment and Plan OT Assessment and Plan Clinical Impression Statement: A:  Please see MD progress note in letter portion of this chart for details.  Transitioned ther ex from a ROM focus to a strengthening focus. OT Frequency: Min 2X/week OT Duration: 4 weeks OT Plan: P:  Add UBE, add ball on wall exercise and functional exercises that will promote external rotation.     Goals Short Term Goals Time to Complete Short Term Goals: 3 weeks Short Term Goal 1: Patient will be educated on HEP. Short Term Goal 1 Progress: Met Short Term Goal 2: Patient will increase AROM by 10 in her right shoulder for increased ability to reach into overhead cabinet. Short Term Goal 2 Progress: Met Short Term Goal 3: Patient will increase right shoulder strength to 4/5 for increased ability to lift sports equipment when playing with her son. Short Term Goal 3 Progress: Met Short Term Goal 4: Patient will decrease fascial restrictions in her right shoulder region.   Short Term Goal 4 Progress: Met Short Term Goal 5: Patient will decrease pain to 2/10 in her right shoulder when erasing the board at school. Short Term Goal 5 Progress: Met Long Term Goals Time to Complete Long Term Goals: 6 weeks Long Term Goal 1: Patient will return to prior level of I with all B/IADL, work, and leisure activities. Long Term Goal 1 Progress: Progressing toward goal Long Term Goal 2: Patient will increase right shoulder AROM to WNL for increased ability to participate in sports with her son. Long Term Goal 2 Progress: Progressing toward goal Long Term Goal 3: Patient  will increase right shoulder strength to 5/5 for increased ability to lift plates into  overhead cabinets. Long Term Goal 3 Progress: Progressing toward goal Long Term Goal 4: Patient will decrease pain in her right shoulder to 1/10 when erasing the board at school. Long Term Goal 4 Progress: Progressing toward goal Long Term Goal 5: Patient will decrease fascial restrictions to trace in her right shoulder.   Long Term Goal 5 Progress: Progressing toward goal  Problem List Patient Active Problem List  Diagnosis  . Pain in joint, shoulder region  . Muscle weakness (generalized)  . Tendonitis of shoulder, right    End of Session Activity Tolerance: Patient tolerated treatment well General Behavior During Session: [facility] for tasks performed Cognition: [facility] for tasks performed  GO    Shirlean Mylar, OTR/L  11/04/2012, 4:57 PM

## 2012-11-06 ENCOUNTER — Ambulatory Visit (HOSPITAL_COMMUNITY)
Admission: RE | Admit: 2012-11-06 | Discharge: 2012-11-06 | Disposition: A | Discharge status: Home / Self Care | Payer: Self-pay | Source: Ambulatory Visit | Attending: Rheumatology | Admitting: Rheumatology

## 2012-11-06 NOTE — Progress Notes (Signed)
Occupational Therapy Treatment Patient Details  Name: Karen Roach MRN: [id number] Date of Birth: [birth year]  Today's Date: 11/06/2012 Time: 1526-1600 OT Time Calculation (min): 34 min Manual Therapy 9629-5284 11' Therapeutic Exercises 1537-1600 23' Visit#: 6  of 12   Re-eval: 12/02/12    Authorization: n/a   Subjective  S:  I liked the strengthening exercises last time.  They were hard, but my shoulder felt better afterwards. Pain Assessment Currently in Pain?: Yes Pain Score:   1 Pain Location: Shoulder Pain Orientation: Right Pain Type: Acute pain  Precautions/Restrictions   progress as tolerated  Exercise/Treatments Supine Protraction: PROM;10 reps Horizontal ABduction: PROM;10 reps External Rotation: PROM;10 reps Internal Rotation: PROM;10 reps Flexion: PROM;10 reps ABduction: PROM;10 reps Seated Retraction: Strengthening;15 reps Retraction Weight (lbs): 1 Row: Strengthening;15 reps Row Weight (lbs): 1 Protraction: Strengthening;15 reps Protraction Weight (lbs): 1 Horizontal ABduction: Strengthening;15 reps Horizontal ABduction Weight (lbs): 1 External Rotation: Strengthening;15 reps External Rotation Weight (lbs): 1 Internal Rotation: Strengthening;15 reps Internal Rotation Weight (lbs): 1 Flexion: Strengthening;15 reps Flexion Weight (lbs): 1 Abduction: Strengthening;15 reps ABduction Weight (lbs): 1 Standing External Rotation: Theraband;15 reps Theraband Level (Shoulder External Rotation): Level 3 (Green) Internal Rotation: Theraband;15 reps Theraband Level (Shoulder Internal Rotation): Level 3 (Green) Extension: Theraband;15 reps Theraband Level (Shoulder Extension): Level 3 (Green) Row: Theraband;15 reps Theraband Level (Shoulder Row): Level 3 (Green) Retraction: Theraband;15 reps Theraband Level (Shoulder Retraction): Level 3 (Green) ROM / Strengthening / Isometric Strengthening UBE (Upper Arm Bike): 3' and 3' 1.5 Wall Wash: 3' with  1# "W" Arms: 15 reps with 1# X to V Arms: 15 reps with 1# Proximal Shoulder Strengthening, Seated: 10 reps with 1# without restbreaks      Manual Therapy Manual Therapy: Myofascial release Myofascial Release: MFR and manual stretching to right upper arm, anterior shoulder, scapular region, trapezius region to decrease pain and restrictions and increase pain free mobility in her right shoulder region  Occupational Therapy Assessment and Plan OT Assessment and Plan Clinical Impression Statement: A:  Patient able to increase strengthening reps this date and reported feeling better after therapy with strengthening exercises at last visit.  Added UBE for sustained activity tolerance in right shoulder. OT Frequency: Min 2X/week OT Duration: 4 weeks OT Plan: P:  Increase reps with scapular stabilizing exercises.    Goals Short Term Goals Time to Complete Short Term Goals: 3 weeks Short Term Goal 1: Patient will be educated on HEP. Short Term Goal 2: Patient will increase AROM by 10 in her right shoulder for increased ability to reach into overhead cabinet. Short Term Goal 3: Patient will increase right shoulder strength to 4/5 for increased ability to lift sports equipment when playing with her son. Short Term Goal 4: Patient will decrease fascial restrictions in her right shoulder region.   Short Term Goal 5: Patient will decrease pain to 2/10 in her right shoulder when erasing the board at school. Long Term Goals Time to Complete Long Term Goals: 6 weeks Long Term Goal 1: Patient will return to prior level of I with all B/IADL, work, and leisure activities. Long Term Goal 2: Patient will increase right shoulder AROM to WNL for increased ability to participate in sports with her son. Long Term Goal 3: Patient will increase right shoulder strength to 5/5 for increased ability to lift plates into overhead cabinets. Long Term Goal 4: Patient will decrease pain in her right shoulder to 1/10 when  erasing the board at school. Long Term Goal 5: Patient will decrease  fascial restrictions to trace in her right shoulder.    Problem List Patient Active Problem List  Diagnosis  . Pain in joint, shoulder region  . Muscle weakness (generalized)  . Tendonitis of shoulder, right    End of Session Activity Tolerance: Patient tolerated treatment well General Behavior During Session: [facility] for tasks performed Cognition: [facility] for tasks performed  GO    Shirlean Mylar, OTR/L  11/06/2012, 4:02 PM

## 2012-11-11 ENCOUNTER — Ambulatory Visit (HOSPITAL_COMMUNITY): Payer: Self-pay | Admitting: Specialist

## 2012-11-13 ENCOUNTER — Ambulatory Visit (HOSPITAL_COMMUNITY)
Admission: RE | Admit: 2012-11-13 | Discharge: 2012-11-13 | Disposition: A | Discharge status: Home / Self Care | Payer: Self-pay | Source: Ambulatory Visit | Attending: Rheumatology | Admitting: Rheumatology

## 2012-11-13 DIAGNOSIS — M6281 Muscle weakness (generalized): Secondary | ICD-10-CM

## 2012-11-13 DIAGNOSIS — M25519 Pain in unspecified shoulder: Secondary | ICD-10-CM

## 2012-11-13 NOTE — Progress Notes (Signed)
Occupational Therapy Treatment Patient Details  Name: Karen Roach MRN: [id number] Date of Birth: [birth year]  Today's Date: 11/13/2012 Time: 5621-3086 OT Time Calculation (min): 39 min Manual Therapy [phone] 20' Therapeutic Exercises [phone] 24' Visit#: 7  of 12   Re-eval: 12/02/12     Subjective S:  I tried throwing snowballs when I was at my brothers, I still can't throw overhand with much force.  Pain Assessment Currently in Pain?: Yes Pain Score:   1 Pain Location: Shoulder Pain Orientation: Right Pain Type: Acute pain  Precautions/Restrictions   progress as tolerated  Exercise/Treatments Supine Protraction: PROM;10 reps Horizontal ABduction: PROM;10 reps External Rotation: PROM;10 reps Internal Rotation: PROM;10 reps Flexion: PROM;10 reps ABduction: PROM;10 reps Seated Protraction: Strengthening;10 reps Protraction Weight (lbs): 2 Horizontal ABduction: Strengthening;10 reps Horizontal ABduction Weight (lbs): 2 External Rotation: Strengthening;10 reps External Rotation Weight (lbs): 2 Internal Rotation: Strengthening;10 reps Internal Rotation Weight (lbs): 2 Flexion: Strengthening;10 reps Flexion Weight (lbs): 2 Abduction: Strengthening;10 reps ABduction Weight (lbs): 2 Standing External Rotation: Theraband;15 reps Theraband Level (Shoulder External Rotation): Level 3 (Green) Internal Rotation: Theraband;15 reps Theraband Level (Shoulder Internal Rotation): Level 3 (Green) Extension: Theraband;15 reps Theraband Level (Shoulder Extension): Level 3 (Green) Row: Theraband;15 reps Theraband Level (Shoulder Row): Level 3 (Green) Retraction: Theraband;15 reps Theraband Level (Shoulder Retraction): Level 3 (Green) Other Standing Exercises: theraband positioned low to ground, went through motion of throwing a ball overhand with theraband x 20 reps ROM / Strengthening / Isometric Strengthening Rebounder: green weighted ball x 25 reps overhand  UBE  (Upper Arm Bike): 3' and 3' 2.0 Wall Wash: 2' with 2# "W" Arms: 10 reps with 2# X to V Arms: 10 reps with 2 # Proximal Shoulder Strengthening, Seated: resume next visit      Manual Therapy Manual Therapy: Myofascial release Myofascial Release: MFR and manual stretching to right upper arm, anterior shoulder, scapular region, trapezius region to decrease pain and restrictions and increase pain free mobility in her right shoulder region   Occupational Therapy Assessment and Plan OT Assessment and Plan Clinical Impression Statement: A:  Added exercises at rebounder and theraband to simulate throwing ball overhead.     Goals Short Term Goals Time to Complete Short Term Goals: 3 weeks Short Term Goal 1: Patient will be educated on HEP. Short Term Goal 2: Patient will increase AROM by 10 in her right shoulder for increased ability to reach into overhead cabinet. Short Term Goal 3: Patient will increase right shoulder strength to 4/5 for increased ability to lift sports equipment when playing with her son. Short Term Goal 4: Patient will decrease fascial restrictions in her right shoulder region.   Short Term Goal 5: Patient will decrease pain to 2/10 in her right shoulder when erasing the board at school. Long Term Goals Time to Complete Long Term Goals: 6 weeks Long Term Goal 1: Patient will return to prior level of I with all B/IADL, work, and leisure activities. Long Term Goal 1 Progress: Progressing toward goal Long Term Goal 2: Patient will increase right shoulder AROM to WNL for increased ability to participate in sports with her son. Long Term Goal 2 Progress: Progressing toward goal Long Term Goal 3: Patient will increase right shoulder strength to 5/5 for increased ability to lift plates into overhead cabinets. Long Term Goal 3 Progress: Progressing toward goal Long Term Goal 4: Patient will decrease pain in her right shoulder to 1/10 when erasing the board at school. Long Term Goal  4 Progress: Progressing  toward goal Long Term Goal 5: Patient will decrease fascial restrictions to trace in her right shoulder.   Long Term Goal 5 Progress: Progressing toward goal  Problem List Patient Active Problem List  Diagnosis  . Pain in joint, shoulder region  . Muscle weakness (generalized)  . Tendonitis of shoulder, right    End of Session Activity Tolerance: Patient tolerated treatment well General Behavior During Session: [facility] for tasks performed Cognition: [facility] for tasks performed  Shirlean Mylar, OTR/L  11/13/2012, 4:11 PM

## 2012-11-18 ENCOUNTER — Ambulatory Visit (HOSPITAL_COMMUNITY): Payer: Self-pay

## 2012-11-20 ENCOUNTER — Ambulatory Visit (HOSPITAL_COMMUNITY)
Admission: RE | Admit: 2012-11-20 | Discharge: 2012-11-20 | Disposition: A | Discharge status: Home / Self Care | Payer: Self-pay | Source: Ambulatory Visit | Attending: Gastroenterology | Admitting: Gastroenterology

## 2012-11-20 DIAGNOSIS — IMO0001 Reserved for inherently not codable concepts without codable children: Secondary | ICD-10-CM | POA: Insufficient documentation

## 2012-11-20 DIAGNOSIS — M6281 Muscle weakness (generalized): Secondary | ICD-10-CM | POA: Insufficient documentation

## 2012-11-20 DIAGNOSIS — M25519 Pain in unspecified shoulder: Secondary | ICD-10-CM | POA: Insufficient documentation

## 2012-11-20 NOTE — Progress Notes (Signed)
Occupational Therapy Treatment Patient Details  Name: Karen Roach MRN: [id number] Date of Birth: Aug 25, 1961  Today's Date: 11/20/2012 Time: 2956-2130 OT Time Calculation (min): 40 min Manual Therapy [id number] 17' Therapeutic Exercises [phone] 23'  Visit#: 8  of 12   Re-eval: 12/02/12     Subjective S:  I had to sleep on a blow up mattress over the weekend, and it was quite painful on my shoulder. Pain Assessment Currently in Pain?: Yes Pain Score:   4 Pain Location: Shoulder Pain Orientation: Right Pain Type: Acute pain  Precautions/Restrictions   n/a  Exercise/Treatments Supine Protraction: PROM;10 reps Horizontal ABduction: PROM;10 reps External Rotation: PROM;10 reps Internal Rotation: PROM;10 reps Flexion: PROM;10 reps ABduction: PROM;10 reps Seated Protraction: AAROM;12 reps Protraction Weight (lbs): 2 Horizontal ABduction: Strengthening;12 reps Horizontal ABduction Weight (lbs): 2 External Rotation: Strengthening;12 reps External Rotation Weight (lbs): 2 Internal Rotation: Strengthening;12 reps Internal Rotation Weight (lbs): 2 Flexion: Strengthening;12 reps Flexion Weight (lbs): 2 Abduction: Strengthening;12 reps ABduction Weight (lbs): 2 Standing External Rotation: Theraband;15 reps Theraband Level (Shoulder External Rotation): Level 3 (Green) Internal Rotation: Theraband;15 reps Theraband Level (Shoulder Internal Rotation): Level 3 (Green) Extension: Theraband;15 reps Theraband Level (Shoulder Extension): Level 3 (Green) Row: Theraband;15 reps Theraband Level (Shoulder Row): Level 3 (Green) Retraction: Theraband;15 reps Theraband Level (Shoulder Retraction): Level 3 (Green) Other Standing Exercises: theraband positioned low to ground, went through motion of throwing a ball overhand with theraband x 20 reps ROM / Strengthening / Isometric Strengthening Rebounder: green weighted ball x 30 reps overhand  UBE (Upper Arm Bike): 3' and 3'  2.0 "W" Arms: 12 reps with 2# X to V Arms: 12 reps with 2# Proximal Shoulder Strengthening, Seated: 10 reps with 2# with 1 reps break [employer] P[employer] on Wall: 2' with greenweighted ball      Manual Therapy Manual Therapy: Myofascial release Myofascial Release: MFR and manual stretching to right upper arm, anterior shoulder, scapular region, trapezius region to decrease pain and restrictions and increase pain free mobility in her right shoulder region   Occupational Therapy Assessment and Plan OT Assessment and Plan Clinical Impression Statement: A:  Complete proximal shoulder strengthening without rest breaks.  OT Plan: P: Increase to red weighted ball with throwing activity for increased challenge.  Add cybex press and row for increased scapular stability.   Goals Short Term Goals Time to Complete Short Term Goals: 3 weeks Short Term Goal 1: Patient will be educated on HEP. Short Term Goal 2: Patient will increase AROM by 10 in her right shoulder for increased ability to reach into overhead cabinet. Short Term Goal 3: Patient will increase right shoulder strength to 4/5 for increased ability to lift sports equipment when playing with her son. Short Term Goal 4: Patient will decrease fascial restrictions in her right shoulder region.   Short Term Goal 5: Patient will decrease pain to 2/10 in her right shoulder when erasing the board at school. Long Term Goals Time to Complete Long Term Goals: 6 weeks Long Term Goal 1: Patient will return to prior level of I with all B/IADL, work, and leisure activities. Long Term Goal 1 Progress: Progressing toward goal Long Term Goal 2: Patient will increase right shoulder AROM to WNL for increased ability to participate in sports with her son. Long Term Goal 2 Progress: Progressing toward goal Long Term Goal 3: Patient will increase right shoulder strength to 5/5 for increased ability to lift plates into overhead cabinets. Long Term Goal 3 Progress: Progressing  toward goal Long  Term Goal 4: Patient will decrease pain in her right shoulder to 1/10 when erasing the board at school. Long Term Goal 4 Progress: Progressing toward goal Long Term Goal 5: Patient will decrease fascial restrictions to trace in her right shoulder.   Long Term Goal 5 Progress: Progressing toward goal  Problem List Patient Active Problem List  Diagnosis  . Pain in joint, shoulder region  . Muscle weakness (generalized)  . Tendonitis of shoulder, right    End of Session Activity Tolerance: Patient tolerated treatment well General Behavior During Session: [facility] for tasks performed Cognition: [facility] for tasks performed  GO    Shirlean Mylar, OTR/L  11/20/2012, 4:40 PM

## 2012-11-25 ENCOUNTER — Ambulatory Visit (HOSPITAL_COMMUNITY)
Admission: RE | Admit: 2012-11-25 | Discharge: 2012-11-25 | Disposition: A | Discharge status: Home / Self Care | Payer: Self-pay | Source: Ambulatory Visit | Attending: Rheumatology | Admitting: Rheumatology

## 2012-11-25 NOTE — Progress Notes (Signed)
Occupational Therapy Treatment Patient Details  Name: ANIJA BRICKNER MRN: [id number] Date of Birth: 22-Apr-1961  Today's Date: 11/25/2012 Time: 4098-1191 OT Time Calculation (min): 49 min MFR 4782-9562 16' Therex 1308-6578 33'  Visit#: 9 of 12  Re-eval: 12/02/12    Authorization: n/a  Authorization Time Period:    Authorization Visit#:   of    Subjective Symptoms/Limitations Symptoms: S: My shoulder was hurting earlier this week. I didn't do anything to it. Pain Assessment Currently in Pain?: No/denies  Precautions/Restrictions     Exercise/Treatments Supine Protraction: PROM;10 reps Horizontal ABduction: PROM;10 reps External Rotation: PROM;10 reps Internal Rotation: PROM;10 reps Flexion: PROM;10 reps ABduction: PROM;10 reps Seated Protraction: Strengthening;12 reps Protraction Weight (lbs): 2 Horizontal ABduction: Strengthening;12 reps Horizontal ABduction Weight (lbs): 2 External Rotation: Strengthening;12 reps External Rotation Weight (lbs): 2 Internal Rotation: Strengthening;12 reps Internal Rotation Weight (lbs): 2 Flexion: Strengthening;12 reps Flexion Weight (lbs): 2 Abduction: Strengthening;12 reps ABduction Weight (lbs): 2 Standing External Rotation: Theraband;15 reps Theraband Level (Shoulder External Rotation): Level 3 (Green) Internal Rotation: Theraband;15 reps Theraband Level (Shoulder Internal Rotation): Level 3 (Green) Extension: Theraband;15 reps Theraband Level (Shoulder Extension): Level 3 (Green) Row: Theraband;15 reps Theraband Level (Shoulder Row): Level 3 (Green) Retraction: Theraband;15 reps Theraband Level (Shoulder Retraction): Level 3 (Green) Other Standing Exercises: theraband positioned low to ground, went through motion of throwing a ball overhand with theraband x 20 reps ROM / Strengthening / Isometric Strengthening Rebounder: red weighted ball x30 reps overhead UBE (Upper Arm Bike): 3' and 3' 2.0 Wall Wash: 2' with  2# "W" Arms: 12 reps with 2# X to V Arms: 12 reps with 2# Proximal Shoulder Strengthening, Seated: 10 reps with 2# with 1 reps break [employer] P[employer] on Wall: 2' with greenweighted ball      Manual Therapy Manual Therapy: Myofascial release Myofascial Release: MFR and manual stretching to right upper arm, anterior shoulder, scapular region, trapezius region to decrease pain and restrictions and increase pain free mobility in her right shoulder region   Occupational Therapy Assessment and Plan OT Assessment and Plan Clinical Impression Statement: A: Added red weighted ball to rebound exercise. Tolerated well. OT Plan: P:  Add cybex press and row for increased scapular stability.   Goals Short Term Goals Time to Complete Short Term Goals: 3 weeks Short Term Goal 1: Patient will be educated on HEP. Short Term Goal 2: Patient will increase AROM by 10 in her right shoulder for increased ability to reach into overhead cabinet. Short Term Goal 3: Patient will increase right shoulder strength to 4/5 for increased ability to lift sports equipment when playing with her son. Short Term Goal 4: Patient will decrease fascial restrictions in her right shoulder region.   Short Term Goal 5: Patient will decrease pain to 2/10 in her right shoulder when erasing the board at school. Long Term Goals Time to Complete Long Term Goals: 6 weeks Long Term Goal 1: Patient will return to prior level of I with all B/IADL, work, and leisure activities. Long Term Goal 1 Progress: Progressing toward goal Long Term Goal 2: Patient will increase right shoulder AROM to WNL for increased ability to participate in sports with her son. Long Term Goal 2 Progress: Progressing toward goal Long Term Goal 3: Patient will increase right shoulder strength to 5/5 for increased ability to lift plates into overhead cabinets. Long Term Goal 3 Progress: Progressing toward goal Long Term Goal 4: Patient will decrease pain in her right shoulder  to 1/10 when erasing the board  at school. Long Term Goal 4 Progress: Progressing toward goal Long Term Goal 5: Patient will decrease fascial restrictions to trace in her right shoulder.   Long Term Goal 5 Progress: Progressing toward goal  Problem List Patient Active Problem List  Diagnosis  . Pain in joint, shoulder region  . Muscle weakness (generalized)  . Tendonitis of shoulder, right    End of Session Activity Tolerance: Patient tolerated treatment well General Behavior During Session: [facility] for tasks performed Cognition: [facility] for tasks performed   Limmie Patricia, OTR/L  11/25/2012, 4:19 PM

## 2012-11-27 ENCOUNTER — Ambulatory Visit (HOSPITAL_COMMUNITY): Payer: Self-pay | Admitting: Specialist

## 2012-12-02 ENCOUNTER — Ambulatory Visit (HOSPITAL_COMMUNITY)
Admission: RE | Admit: 2012-12-02 | Discharge: 2012-12-02 | Disposition: A | Discharge status: Home / Self Care | Payer: Self-pay | Source: Ambulatory Visit | Attending: Rheumatology | Admitting: Rheumatology

## 2012-12-02 DIAGNOSIS — M6281 Muscle weakness (generalized): Secondary | ICD-10-CM

## 2012-12-02 DIAGNOSIS — M25519 Pain in unspecified shoulder: Secondary | ICD-10-CM

## 2012-12-02 DIAGNOSIS — M778 Other enthesopathies, not elsewhere classified: Secondary | ICD-10-CM

## 2012-12-02 NOTE — Progress Notes (Signed)
Occupational Therapy Treatment Patient Details  Name: ZAYNA TOSTE MRN: [id number] Date of Birth: 16-Sep-1961  Today's Date: 12/02/2012 Time: 4098-1191 OT Time Calculation (min): 46 min MFR 4782-9562 18' Re-assess 1308-6578 10'  Therex 4696-2952 18'  Visit#: 10 of 20  Re-eval: 12/30/12    Authorization: n/a  Authorization Time Period:    Authorization Visit#:   of    Subjective Symptoms/Limitations Symptoms: S: My shoulder has been terrible at night. I've done a lot of shoveling. Pain Assessment Pain Score:   6 (at night) Pain Location: Shoulder Pain Orientation: Right Pain Type: Acute pain  Precautions/Restrictions  Precautions Precautions: None  Exercise/Treatments Supine Protraction: PROM;10 reps Horizontal ABduction: PROM;10 reps External Rotation: PROM;10 reps Internal Rotation: PROM;10 reps Flexion: PROM;10 reps ABduction: PROM;10 reps Seated Protraction: Strengthening;15 reps Protraction Weight (lbs): 2 Horizontal ABduction: Strengthening;15 reps Horizontal ABduction Weight (lbs): 2 External Rotation: Strengthening;15 reps External Rotation Weight (lbs): 2 Internal Rotation: Strengthening;15 reps Internal Rotation Weight (lbs): 2 Flexion: Strengthening;12 reps Flexion Weight (lbs): 2 Abduction: Strengthening;15 reps ABduction Weight (lbs): 2 Standing External Rotation: Theraband;20 reps Theraband Level (Shoulder External Rotation): Level 3 (Green) Internal Rotation: Theraband;20 reps Theraband Level (Shoulder Internal Rotation): Level 3 (Green) Extension: Theraband;20 reps Theraband Level (Shoulder Extension): Level 3 (Green) Row: Theraband;20 reps Theraband Level (Shoulder Row): Level 3 (Green) Retraction: Theraband;20 reps Theraband Level (Shoulder Retraction): Level 3 (Green) Other Standing Exercises: theraband positioned low to ground, went through motion of throwing a ball overhand with theraband x 20 reps ROM / Strengthening / Isometric  Strengthening Rebounder: red weighted ball x30 reps overhead UBE (Upper Arm Bike): 3' and 3' 2.0 "W" Arms: 15 reps with 2# X to V Arms: 15 reps with 2# Proximal Shoulder Strengthening, Seated: 10 reps with 2# with 1 reps break      Manual Therapy Manual Therapy: Myofascial release Myofascial Release: MFR and manual stretching to right upper arm, anterior shoulder, scapular region, trapezius region to decrease pain and restrictions and increase pain free mobility in her right shoulder region   Occupational Therapy Assessment and Plan OT Assessment and Plan Clinical Impression Statement: A: See MD note for progress. OT Plan: P:  Add cybex press and row for increased scapular stability.   Goals Short Term Goals Time to Complete Short Term Goals: 3 weeks Short Term Goal 1: Patient will be educated on HEP. Short Term Goal 2: Patient will increase AROM by 10 in her right shoulder for increased ability to reach into overhead cabinet. Short Term Goal 3: Patient will increase right shoulder strength to 4/5 for increased ability to lift sports equipment when playing with her son. Short Term Goal 4: Patient will decrease fascial restrictions in her right shoulder region.   Short Term Goal 5: Patient will decrease pain to 2/10 in her right shoulder when erasing the board at school. Long Term Goals Time to Complete Long Term Goals: 6 weeks Long Term Goal 1: Patient will return to prior level of I with all B/IADL, work, and leisure activities. Long Term Goal 1 Progress: Progressing toward goal Long Term Goal 2: Patient will increase right shoulder AROM to WNL for increased ability to participate in sports with her son. Long Term Goal 2 Progress: Progressing toward goal Long Term Goal 3: Patient will increase right shoulder strength to 5/5 for increased ability to lift plates into overhead cabinets. Long Term Goal 3 Progress: Progressing toward goal Long Term Goal 4: Patient will decrease pain in  her right shoulder to 1/10 when erasing  the board at school. Long Term Goal 4 Progress: Met Long Term Goal 5: Patient will decrease fascial restrictions to trace in her right shoulder.   Long Term Goal 5 Progress: Progressing toward goal  Problem List Patient Active Problem List  Diagnosis  . Pain in joint, shoulder region  . Muscle weakness (generalized)  . Tendonitis of shoulder, right    End of Session Activity Tolerance: Patient tolerated treatment well General Behavior During Session: [facility] for tasks performed Cognition: [facility] for tasks performed   Limmie Patricia, OTR/L  12/02/2012, 4:10 PM

## 2012-12-04 ENCOUNTER — Ambulatory Visit (HOSPITAL_COMMUNITY)
Admission: RE | Admit: 2012-12-04 | Discharge: 2012-12-04 | Disposition: A | Discharge status: Home / Self Care | Payer: Self-pay | Source: Ambulatory Visit | Attending: Rheumatology | Admitting: Rheumatology

## 2012-12-04 NOTE — Progress Notes (Signed)
Occupational Therapy Treatment Patient Details  Name: Karen Roach MRN: [id number] Date of Birth: 04-22-1961  Today's Date: 12/04/2012 Time: 4098-1191 OT Time Calculation (min): 46 min MFR 4782-9562 15' Therex 1308-6578 31'  Visit#: 11 of 20  Re-eval: 12/30/12    Authorization: n/a  Authorization Time Period:    Authorization Visit#:   of    Subjective Symptoms/Limitations Symptoms: S: I still sleep on my shoulder funny and it is still painful at night. Pain Assessment Currently in Pain?: Yes Pain Score:   1 Pain Location: Shoulder Pain Orientation: Right Pain Type: Acute pain  Precautions/Restrictions  Precautions Precautions: None  Exercise/Treatments Supine Protraction: PROM;10 reps Horizontal ABduction: PROM;10 reps External Rotation: PROM;10 reps Internal Rotation: PROM;10 reps Flexion: PROM;10 reps ABduction: PROM;10 reps Seated Protraction: Strengthening;20 reps Protraction Weight (lbs): 2 Horizontal ABduction: Strengthening;20 reps Horizontal ABduction Weight (lbs): 2 External Rotation: Strengthening;20 reps External Rotation Weight (lbs): 2 Internal Rotation: Strengthening;20 reps Internal Rotation Weight (lbs): 2 Flexion: Strengthening;20 reps Flexion Weight (lbs): 2 Abduction: Strengthening;20 reps ABduction Weight (lbs): 2 Standing External Rotation: Theraband;20 reps Theraband Level (Shoulder External Rotation): Level 3 (Green) Internal Rotation: Theraband;20 reps Theraband Level (Shoulder Internal Rotation): Level 3 (Green) Flexion: Theraband;20 reps Theraband Level (Shoulder Flexion): Level 3 (Green) Extension: Theraband;20 reps Theraband Level (Shoulder Extension): Level 3 (Green) Row: Theraband;20 reps Theraband Level (Shoulder Row): Level 3 (Green) Retraction: Theraband;20 reps Theraband Level (Shoulder Retraction): Level 3 (Green) Other Standing Exercises: theraband positioned low to ground, went through motion of throwing a  ball overhand with theraband x 20 reps ROM / Strengthening / Isometric Strengthening Rebounder: red weighted ball x30 reps overhead UBE (Upper Arm Bike): 3' and 3' 2.5 Wall Wash: 2' with 2# "W" Arms: 20 reps with 2# X to V Arms: 20 reps with 2#       Manual Therapy Manual Therapy: Myofascial release Myofascial Release: MFR and manual stretching to right upper arm, anterior shoulder, scapular region, trapezius region to decrease pain and restrictions and increase pain free mobility in her right shoulder region   Occupational Therapy Assessment and Plan OT Assessment and Plan Clinical Impression Statement: A: Increased reps on portion of exercises. Increased UBE workload to 2.5. Tolerated well. OT Plan: P:  Add cybex press and row for increased scapular stability.   Goals Short Term Goals Time to Complete Short Term Goals: 3 weeks Short Term Goal 1: Patient will be educated on HEP. Short Term Goal 2: Patient will increase AROM by 10 in her right shoulder for increased ability to reach into overhead cabinet. Short Term Goal 3: Patient will increase right shoulder strength to 4/5 for increased ability to lift sports equipment when playing with her son. Short Term Goal 4: Patient will decrease fascial restrictions in her right shoulder region.   Short Term Goal 5: Patient will decrease pain to 2/10 in her right shoulder when erasing the board at school. Long Term Goals Time to Complete Long Term Goals: 6 weeks Long Term Goal 1: Patient will return to prior level of I with all B/IADL, work, and leisure activities. Long Term Goal 1 Progress: Progressing toward goal Long Term Goal 2: Patient will increase right shoulder AROM to WNL for increased ability to participate in sports with her son. Long Term Goal 2 Progress: Progressing toward goal Long Term Goal 3: Patient will increase right shoulder strength to 5/5 for increased ability to lift plates into overhead cabinets. Long Term Goal 3  Progress: Progressing toward goal Long Term Goal 4: Patient  will decrease pain in her right shoulder to 1/10 when erasing the board at school. Long Term Goal 5: Patient will decrease fascial restrictions to trace in her right shoulder.   Long Term Goal 5 Progress: Progressing toward goal  Problem List Patient Active Problem List  Diagnosis  . Pain in joint, shoulder region  . Muscle weakness (generalized)  . Tendonitis of shoulder, right    End of Session Activity Tolerance: Patient tolerated treatment well General Behavior During Session: [facility] for tasks performed Cognition: [facility] for tasks performed   Limmie Patricia, OTR/L  12/04/2012, 4:07 PM

## 2012-12-09 ENCOUNTER — Ambulatory Visit (HOSPITAL_COMMUNITY)
Admission: RE | Admit: 2012-12-09 | Discharge: 2012-12-09 | Disposition: A | Discharge status: Home / Self Care | Payer: Self-pay | Source: Ambulatory Visit | Attending: Rheumatology | Admitting: Rheumatology

## 2012-12-09 DIAGNOSIS — M6281 Muscle weakness (generalized): Secondary | ICD-10-CM

## 2012-12-09 NOTE — Progress Notes (Signed)
Occupational Therapy Treatment Patient Details  Name: Karen Roach MRN: [id number] Date of Birth: 06/28/1961  Today's Date: 12/09/2012 Time: 4098-1191 OT Time Calculation (min): 45 min MFR 4782-9562 10' Therex 1308-6578 35'  Visit#: 12 of 20  Re-eval: 12/30/12    Authorization: n/a  Authorization Time Period:    Authorization Visit#:   of    Subjective Symptoms/Limitations Symptoms: S: It only hurts a lttle bit at night. Pain Assessment Currently in Pain?: Yes Pain Score:   1 Pain Location: Shoulder Pain Orientation: Right Pain Type: Acute pain Pain Radiating Towards: at night only.  Precautions/Restrictions  Precautions Precautions: None  Exercise/Treatments Supine Protraction: PROM;5 reps Horizontal ABduction: PROM;5 reps External Rotation: PROM;5 reps Internal Rotation: PROM;5 reps Flexion: PROM;5 reps ABduction: PROM;5 reps Seated Elevation: Strengthening;20 reps;Weights Elevation Weight (lbs): 2# Extension: Strengthening;20 reps Extension Weight (lbs): 2# Retraction: Strengthening;20 reps Retraction Weight (lbs): 2# Row: Strengthening;10 reps;Weights Row Weight (lbs): 2# Protraction: Strengthening;20 reps Protraction Weight (lbs): 2 Horizontal ABduction: Strengthening;20 reps Horizontal ABduction Weight (lbs): 2 External Rotation: Strengthening;20 reps External Rotation Weight (lbs): 2 Internal Rotation: Strengthening;20 reps Internal Rotation Weight (lbs): 2 Flexion: Strengthening;20 reps Flexion Weight (lbs): 2 Abduction: Strengthening;20 reps ABduction Weight (lbs): 2 Standing External Rotation: Theraband;20 reps Theraband Level (Shoulder External Rotation): Level 3 (Green) Internal Rotation: Theraband;20 reps Theraband Level (Shoulder Internal Rotation): Level 3 (Green) Flexion: Theraband;20 reps Theraband Level (Shoulder Flexion): Level 3 (Green) Extension: Theraband;20 reps Theraband Level (Shoulder Extension): Level 3 (Green) Row:  Theraband;20 reps Theraband Level (Shoulder Row): Level 3 (Green) Retraction: Theraband;20 reps Theraband Level (Shoulder Retraction): Level 3 (Green) Other Standing Exercises: theraband positioned low to ground, went through motion of throwing a ball overhand with theraband x 20 reps ROM / Strengthening / Isometric Strengthening Rebounder: yellow weighted ball x30 reps overhead Cybex Press: 2 plate;15 reps Cybex Row: 2 plate;15 reps Ball on Wall: 2" with red weighted ball       Manual Therapy Manual Therapy: Myofascial release Myofascial Release: MFR and manual stretching to right upper arm, anterior shoulder, scapular region, trapezius region to decrease pain and restrictions and increase pain free mobility in her right shoulder region   Occupational Therapy Assessment and Plan OT Assessment and Plan Clinical Impression Statement: A: Added cybex row and press. Increased weighted ball for overhead throw. Tolerated well. OT Plan: P: Add to HEP (theraband etc) and D/C next session.   Goals Short Term Goals Time to Complete Short Term Goals: 3 weeks Short Term Goal 1: Patient will be educated on HEP. Short Term Goal 2: Patient will increase AROM by 10 in her right shoulder for increased ability to reach into overhead cabinet. Short Term Goal 3: Patient will increase right shoulder strength to 4/5 for increased ability to lift sports equipment when playing with her son. Short Term Goal 4: Patient will decrease fascial restrictions in her right shoulder region.   Short Term Goal 5: Patient will decrease pain to 2/10 in her right shoulder when erasing the board at school. Long Term Goals Time to Complete Long Term Goals: 6 weeks Long Term Goal 1: Patient will return to prior level of I with all B/IADL, work, and leisure activities. Long Term Goal 1 Progress: Progressing toward goal Long Term Goal 2: Patient will increase right shoulder AROM to WNL for increased ability to participate in  sports with her son. Long Term Goal 2 Progress: Progressing toward goal Long Term Goal 3: Patient will increase right shoulder strength to 5/5 for increased ability to  lift plates into overhead cabinets. Long Term Goal 3 Progress: Progressing toward goal Long Term Goal 4: Patient will decrease pain in her right shoulder to 1/10 when erasing the board at school. Long Term Goal 5: Patient will decrease fascial restrictions to trace in her right shoulder.   Long Term Goal 5 Progress: Progressing toward goal  Problem List Patient Active Problem List  Diagnosis  . Pain in joint, shoulder region  . Muscle weakness (generalized)  . Tendonitis of shoulder, right    End of Session Activity Tolerance: Patient tolerated treatment well General Behavior During Session: [facility] for tasks performed Cognition: [facility] for tasks performed   Limmie Patricia, OTR/L  12/09/2012, 4:20 PM

## 2012-12-11 ENCOUNTER — Ambulatory Visit (HOSPITAL_COMMUNITY)
Admission: RE | Admit: 2012-12-11 | Discharge: 2012-12-11 | Disposition: A | Discharge status: Home / Self Care | Payer: Self-pay | Source: Ambulatory Visit | Attending: Rheumatology | Admitting: Rheumatology

## 2012-12-11 DIAGNOSIS — M25511 Pain in right shoulder: Secondary | ICD-10-CM

## 2012-12-11 DIAGNOSIS — M6281 Muscle weakness (generalized): Secondary | ICD-10-CM

## 2012-12-11 NOTE — Progress Notes (Signed)
Occupational Therapy Treatment Patient Details  Name: Karen Roach MRN: [id number] Date of Birth: 1961/06/16  Today's Date: 12/11/2012 Time: 4098-1191 OT Time Calculation (min): 22 min MFR 4782-9562 8' Re-assess 1308-6578 5' Pt ed/ADL: 4696-2952 9'  Visit#: 13 of 20  Re-eval: 12/30/12    Authorization: n/a  Authorization Time Period:    Authorization Visit#:   of    Subjective Symptoms/Limitations Symptoms: S: Tuesday I was a little sore from doing new things but today it feels good. Special Tests: DASH scored 11.36 with 0 being ideal score. Pain Assessment Currently in Pain?: No/denies  Precautions/Restrictions  Precautions Precautions: None   Occupational Therapy Assessment and Plan OT Assessment and Plan Clinical Impression Statement: A: See MD note for progress. Patient requested to be discharged from therapy with HEP. Patient has made great progress with therapy. OT Plan: P: D/C from therapy.   Goals Short Term Goals Time to Complete Short Term Goals: 3 weeks Short Term Goal 1: Patient will be educated on HEP. Short Term Goal 2: Patient will increase AROM by 10 in her right shoulder for increased ability to reach into overhead cabinet. Short Term Goal 3: Patient will increase right shoulder strength to 4/5 for increased ability to lift sports equipment when playing with her son. Short Term Goal 4: Patient will decrease fascial restrictions in her right shoulder region.   Short Term Goal 5: Patient will decrease pain to 2/10 in her right shoulder when erasing the board at school. Long Term Goals Time to Complete Long Term Goals: 6 weeks Long Term Goal 1: Patient will return to prior level of I with all B/IADL, work, and leisure activities. Long Term Goal 1 Progress: Met Long Term Goal 2: Patient will increase right shoulder AROM to WNL for increased ability to participate in sports with her son. Long Term Goal 2 Progress: Met Long Term Goal 3: Patient will  increase right shoulder strength to 5/5 for increased ability to lift plates into overhead cabinets. Long Term Goal 3 Progress: Met Long Term Goal 4: Patient will decrease pain in her right shoulder to 1/10 when erasing the board at school. Long Term Goal 5: Patient will decrease fascial restrictions to trace in her right shoulder.   Long Term Goal 5 Progress: Met  Problem List Patient Active Problem List  Diagnosis  . Pain in joint, shoulder region  . Muscle weakness (generalized)  . Tendonitis of shoulder, right    End of Session Activity Tolerance: Patient tolerated treatment well General Behavior During Session: [facility] for tasks performed Cognition: [facility] for tasks performed OT Plan of Care OT Home Exercise Plan: Blue Theraband HEP OT Patient Instructions: handout and demonstration Consulted and Agree with Plan of Care: Patient   Limmie Patricia, OTR/L  12/11/2012, 3:54 PM

## 2012-12-23 ENCOUNTER — Other Ambulatory Visit (HOSPITAL_COMMUNITY): Payer: Self-pay | Admitting: Physician Assistant

## 2012-12-23 DIAGNOSIS — Z139 Encounter for screening, unspecified: Secondary | ICD-10-CM

## 2013-01-15 ENCOUNTER — Ambulatory Visit (HOSPITAL_COMMUNITY)
Admission: RE | Admit: 2013-01-15 | Discharge: 2013-01-15 | Disposition: A | Discharge status: Home / Self Care | Payer: Self-pay | Source: Ambulatory Visit | Attending: Physician Assistant | Admitting: Physician Assistant

## 2013-01-15 DIAGNOSIS — Z139 Encounter for screening, unspecified: Secondary | ICD-10-CM

## 2013-05-11 ENCOUNTER — Encounter: Payer: Self-pay | Admitting: Cardiology

## 2013-06-01 ENCOUNTER — Encounter: Payer: Self-pay | Admitting: *Deleted

## 2013-06-01 DIAGNOSIS — K219 Gastro-esophageal reflux disease without esophagitis: Secondary | ICD-10-CM | POA: Insufficient documentation

## 2013-06-01 DIAGNOSIS — R079 Chest pain, unspecified: Secondary | ICD-10-CM | POA: Insufficient documentation

## 2013-06-01 DIAGNOSIS — I10 Essential (primary) hypertension: Secondary | ICD-10-CM | POA: Insufficient documentation

## 2013-06-01 DIAGNOSIS — E663 Overweight: Secondary | ICD-10-CM | POA: Insufficient documentation

## 2013-06-02 ENCOUNTER — Ambulatory Visit (HOSPITAL_COMMUNITY)
Admission: RE | Admit: 2013-06-02 | Discharge: 2013-06-02 | Disposition: A | Discharge status: Home / Self Care | Payer: Medicaid Other | Source: Ambulatory Visit | Attending: Cardiology | Admitting: Cardiology

## 2013-06-02 ENCOUNTER — Encounter (HOSPITAL_COMMUNITY): Payer: Self-pay | Admitting: Pharmacy Technician

## 2013-06-02 ENCOUNTER — Other Ambulatory Visit: Payer: Self-pay | Admitting: Cardiology

## 2013-06-02 ENCOUNTER — Ambulatory Visit (INDEPENDENT_AMBULATORY_CARE_PROVIDER_SITE_OTHER): Payer: Self-pay | Admitting: Cardiology

## 2013-06-02 ENCOUNTER — Encounter: Payer: Self-pay | Admitting: *Deleted

## 2013-06-02 ENCOUNTER — Encounter: Payer: Self-pay | Admitting: Cardiology

## 2013-06-02 VITALS — BP 143/89 | HR 85 | Ht 66.0 in | Wt 195.4 lb

## 2013-06-02 DIAGNOSIS — Z01818 Encounter for other preprocedural examination: Secondary | ICD-10-CM | POA: Insufficient documentation

## 2013-06-02 DIAGNOSIS — R0602 Shortness of breath: Secondary | ICD-10-CM | POA: Insufficient documentation

## 2013-06-02 DIAGNOSIS — E782 Mixed hyperlipidemia: Secondary | ICD-10-CM

## 2013-06-02 DIAGNOSIS — I200 Unstable angina: Secondary | ICD-10-CM

## 2013-06-02 DIAGNOSIS — I10 Essential (primary) hypertension: Secondary | ICD-10-CM

## 2013-06-02 DIAGNOSIS — R079 Chest pain, unspecified: Secondary | ICD-10-CM | POA: Insufficient documentation

## 2013-06-02 NOTE — Assessment & Plan Note (Signed)
On Lipitor, recent LDL 80.

## 2013-06-02 NOTE — Assessment & Plan Note (Signed)
Symptom description very consistent with accelerating angina. Risk factors include hypertension, hyperlipidemia. Symptom onset within the last 2 months. Recent ECGs reviewed and nonspecific. We have discussed options for diagnostic evaluation, plan to proceed with a diagnostic cardiac catheterization with eye toward revascularization if needed.

## 2013-06-02 NOTE — Patient Instructions (Addendum)
Your physician recommends that you schedule a follow-up appointment in: TO BE DETERMINED  Your physician has requested that you have a cardiac catheterization. Cardiac catheterization is used to diagnose and/or treat various heart conditions. Doctors may recommend this procedure for a number of different reasons. The most common reason is to evaluate chest pain. Chest pain can be a symptom of coronary artery disease (CAD), and cardiac catheterization can show whether plaque is narrowing or blocking your heart's arteries. This procedure is also used to evaluate the valves, as well as measure the blood flow and oxygen levels in different parts of your heart. For further information please visit [email or web address] Please follow instruction sheet, as given.06-04-13 WITH DR Excell Seltzer  Your physician recommends that you return for lab work in: TODAY (SLIPS GIVEN FOR CBC, BMET, PT,PT/INR)  A chest x-ray takes a picture of the organs and structures inside the chest, including the heart, lungs, and blood vessels. This test can show several things, including, whether the heart is enlarges; whether fluid is building up in the lungs; and whether pacemaker / defibrillator leads are still in place.

## 2013-06-02 NOTE — Assessment & Plan Note (Signed)
Blood pressure mildly increased today, she reports compliance with her medications.

## 2013-06-02 NOTE — Progress Notes (Signed)
Clinical Summary Karen Roach is a 52 y.o.female referred for cardiology consultation by Ms. McElroy PA-C with the [facility]. She reports at least a two-month history of progressive exertional chest pain very consistent with angina. She describes a tightness and pressure in her chest with radiation to left arm, occasionally tingling in her jaw. This has been occurring more frequently with exertion. She states that she has been compliant with her medications outlined below. She has not undergone any prior ischemic workup.  Recent lab work revealed potassium 4.3, BUN 20, creatinine 0.9, AST 43, ALT 54, cholesterol 155, triglycerides 106, HDL 34, LDL 80, hemoglobin A1c 6. ECG shows sinus rhythm with nonspecific T-wave flattening.   Allergies  Allergen Reactions  . Ciprofloxacin Itching  . Demerol [Meperidine] Itching  . Tramadol Nausea And Vomiting    Current Outpatient Prescriptions  Medication Sig Dispense Refill  . aspirin 325 MG tablet Take 325 mg by mouth daily.      . calcium carbonate (CALCIUM 600) 600 MG TABS tablet Take 600 mg by mouth daily.      . Choline Fenofibrate (TRILIPIX) 135 MG capsule Take 135 mg by mouth daily.      . Cyanocobalamin (VITAMIN B-12 PO) Take by mouth.      . fish oil-omega-3 fatty acids 1000 MG capsule Take 2 g by mouth daily.      . metoprolol succinate (TOPROL-XL) 100 MG 24 hr tablet Take 100 mg by mouth daily. Take with or immediately following a meal.      . Multiple Vitamin (MULTIVITAMIN) tablet Take 1 tablet by mouth daily.      . Potassium 95 MG TABS Take 1 tablet by mouth daily.      . Quinapril-Hydrochlorothiazide (ACCURETIC PO) Take 10 mg by mouth as directed.       No current facility-administered medications for this visit.    Past Medical History  Diagnosis Date  . GERD (gastroesophageal reflux disease)   . Essential hypertension   . Mixed hyperlipidemia   . Arthritis   . Palpitations     Poossible PVCs based on  description    Past Surgical History  Procedure Laterality Date  . Tubal ligation    . Ganglion cyst resection    . Carpal total release    . Rectocele repair      Family History  Problem Relation Age of Onset  . Colon cancer Father   . Hypertension Mother     Social History Karen Roach reports that she has never smoked. She does not have any smokeless tobacco history on file. Karen Roach reports that she does not drink alcohol.  Review of Systems Reports occasional brief palpitations, no dizziness or syncope. No reported bleeding problems. She has stiffness in her fingers in the mornings. No orthopnea or PND. Occasional ankle and lower leg edema. Otherwise negative.  Physical Examination Filed Vitals:   06/02/13 1448  BP: 143/89  Pulse: 85   Filed Weights   06/02/13 1448  Weight: 195 lb 6.4 oz (88.633 kg)   Appears comfortable at rest. HEENT: Conjunctiva and lids normal, oropharynx clear. Neck: Supple, no elevated JVP or carotid bruits, no thyromegaly. Lungs: Clear to auscultation, nonlabored breathing at rest. Cardiac: Regular rate and rhythm, no S3 or significant systolic murmur, no pericardial rub. Abdomen: Soft, nontender, bowel sounds present, no guarding or rebound. Extremities: Trace edema, distal pulses 2+. Skin: Warm and dry. Musculoskeletal: No kyphosis. Neuropsychiatric: Alert and oriented x3, affect grossly appropriate.  Problem List and Plan   Accelerating angina Symptom description very consistent with accelerating angina. Risk factors include hypertension, hyperlipidemia. Symptom onset within the last 2 months. Recent ECGs reviewed and nonspecific. We have discussed options for diagnostic evaluation, plan to proceed with a diagnostic cardiac catheterization with eye toward revascularization if needed.  Essential hypertension, benign Blood pressure mildly increased today, she reports compliance with her medications.  Mixed hyperlipidemia On  Lipitor, recent LDL 80.    Jonelle Sidle, M.D., F.A.C.C.

## 2013-06-03 ENCOUNTER — Encounter: Payer: Self-pay | Admitting: Cardiology

## 2013-06-03 LAB — BASIC METABOLIC PANEL
BUN: 19 mg/dL (ref 6–23)
CO2: 29 mEq/L (ref 19–32)
Chloride: 104 mEq/L (ref 96–112)
Glucose, Bld: 92 mg/dL (ref 70–99)
Potassium: 4.1 mEq/L (ref 3.5–5.3)

## 2013-06-03 LAB — CBC WITH DIFFERENTIAL/PLATELET
HCT: 38.4 % (ref 36.0–46.0)
Hemoglobin: 12.7 g/dL (ref 12.0–15.0)
Lymphocytes Relative: 37 % (ref 12–46)
Lymphs Abs: 2.6 10*3/uL (ref 0.7–4.0)
MCHC: 33.1 g/dL (ref 30.0–36.0)
Monocytes Absolute: 0.9 10*3/uL (ref 0.1–1.0)
Monocytes Relative: 13 % — ABNORMAL HIGH (ref 3–12)
Neutro Abs: 3.5 10*3/uL (ref 1.7–7.7)
WBC: 7.2 10*3/uL (ref 4.0–10.5)

## 2013-06-04 ENCOUNTER — Encounter (HOSPITAL_COMMUNITY)
Admission: RE | Disposition: A | Discharge status: Home / Self Care | Payer: Self-pay | Source: Ambulatory Visit | Attending: Cardiovascular Disease

## 2013-06-04 ENCOUNTER — Ambulatory Visit (HOSPITAL_COMMUNITY)
Admission: RE | Admit: 2013-06-04 | Discharge: 2013-06-05 | Disposition: A | Discharge status: Home / Self Care | Payer: Medicaid Other | Source: Ambulatory Visit | Attending: Cardiovascular Disease | Admitting: Cardiovascular Disease

## 2013-06-04 ENCOUNTER — Encounter (HOSPITAL_COMMUNITY): Payer: Self-pay | Admitting: General Practice

## 2013-06-04 DIAGNOSIS — I2510 Atherosclerotic heart disease of native coronary artery without angina pectoris: Secondary | ICD-10-CM

## 2013-06-04 DIAGNOSIS — Z79899 Other long term (current) drug therapy: Secondary | ICD-10-CM | POA: Insufficient documentation

## 2013-06-04 DIAGNOSIS — R0789 Other chest pain: Secondary | ICD-10-CM | POA: Insufficient documentation

## 2013-06-04 DIAGNOSIS — E782 Mixed hyperlipidemia: Secondary | ICD-10-CM | POA: Diagnosis present

## 2013-06-04 DIAGNOSIS — Z955 Presence of coronary angioplasty implant and graft: Secondary | ICD-10-CM

## 2013-06-04 DIAGNOSIS — I200 Unstable angina: Secondary | ICD-10-CM

## 2013-06-04 DIAGNOSIS — I10 Essential (primary) hypertension: Secondary | ICD-10-CM | POA: Diagnosis present

## 2013-06-04 HISTORY — PX: CORONARY ANGIOPLASTY WITH STENT PLACEMENT: SHX49

## 2013-06-04 HISTORY — PX: LEFT HEART CATHETERIZATION WITH CORONARY ANGIOGRAM: SHX5451

## 2013-06-04 HISTORY — DX: Migraine, unspecified, not intractable, without status migrainosus: G43.909

## 2013-06-04 HISTORY — DX: Atherosclerotic heart disease of native coronary artery without angina pectoris: I25.10

## 2013-06-04 LAB — POCT ACTIVATED CLOTTING TIME
Activated Clotting Time: 155 seconds
Activated Clotting Time: 473 seconds

## 2013-06-04 SURGERY — LEFT HEART CATHETERIZATION WITH CORONARY ANGIOGRAM
Anesthesia: LOCAL

## 2013-06-04 MED ORDER — SODIUM CHLORIDE 0.9 % IJ SOLN: 3.0000 mL | Freq: Two times a day (BID) | INTRAMUSCULAR | Status: DC

## 2013-06-04 MED ORDER — POTASSIUM 95 MG PO TABS: 1.0000 | ORAL_TABLET | Freq: Every day | ORAL | Status: DC

## 2013-06-04 MED ORDER — SODIUM CHLORIDE 0.9 % IV SOLN: 250.0000 mL | INTRAVENOUS | Status: DC | PRN

## 2013-06-04 MED ORDER — ASPIRIN 81 MG PO CHEW
324.0000 mg | CHEWABLE_TABLET | ORAL | Status: AC
  Administered 2013-06-04: 324 mg via ORAL
  Filled 2013-06-04: qty 4

## 2013-06-04 MED ORDER — SODIUM CHLORIDE 0.9 % IV SOLN
1.0000 mL/kg/h | INTRAVENOUS | Status: AC
  Administered 2013-06-04: 1 mL/kg/h via INTRAVENOUS

## 2013-06-04 MED ORDER — HEPARIN SODIUM (PORCINE) 1000 UNIT/ML IJ SOLN
INTRAMUSCULAR | Status: AC
  Filled 2013-06-04: qty 1

## 2013-06-04 MED ORDER — HEPARIN (PORCINE) IN NACL 2-0.9 UNIT/ML-% IJ SOLN
INTRAMUSCULAR | Status: AC
  Filled 2013-06-04: qty 1000

## 2013-06-04 MED ORDER — CLOPIDOGREL BISULFATE 300 MG PO TABS
ORAL_TABLET | ORAL | Status: AC
  Filled 2013-06-04: qty 2

## 2013-06-04 MED ORDER — NITROGLYCERIN 0.2 MG/ML ON CALL CATH LAB
INTRAVENOUS | Status: AC
  Filled 2013-06-04: qty 1

## 2013-06-04 MED ORDER — ASPIRIN 325 MG PO TABS
325.0000 mg | ORAL_TABLET | Freq: Every day | ORAL | Status: DC
  Administered 2013-06-05: 10:00:00 325 mg via ORAL
  Filled 2013-06-04 (×2): qty 1

## 2013-06-04 MED ORDER — DIAZEPAM 5 MG PO TABS: 5.0000 mg | ORAL_TABLET | ORAL | Status: DC | PRN

## 2013-06-04 MED ORDER — FENTANYL CITRATE 0.05 MG/ML IJ SOLN
INTRAMUSCULAR | Status: AC
  Filled 2013-06-04: qty 2

## 2013-06-04 MED ORDER — METOPROLOL SUCCINATE ER 100 MG PO TB24
100.0000 mg | ORAL_TABLET | Freq: Every day | ORAL | Status: DC
  Administered 2013-06-05: 100 mg via ORAL
  Filled 2013-06-04: qty 1

## 2013-06-04 MED ORDER — HYDROCHLOROTHIAZIDE 12.5 MG PO CAPS
12.5000 mg | ORAL_CAPSULE | Freq: Every day | ORAL | Status: DC
Start: 2013-06-05 — End: 2013-06-05
  Administered 2013-06-05: 10:00:00 12.5 mg via ORAL
  Filled 2013-06-04: qty 1

## 2013-06-04 MED ORDER — SODIUM CHLORIDE 0.9 % IJ SOLN: 3.0000 mL | INTRAMUSCULAR | Status: DC | PRN

## 2013-06-04 MED ORDER — ONDANSETRON HCL 4 MG/2ML IJ SOLN: 4.0000 mg | Freq: Four times a day (QID) | INTRAMUSCULAR | Status: DC | PRN

## 2013-06-04 MED ORDER — MIDAZOLAM HCL 2 MG/2ML IJ SOLN
INTRAMUSCULAR | Status: AC
  Filled 2013-06-04: qty 2

## 2013-06-04 MED ORDER — QUINAPRIL-HYDROCHLOROTHIAZIDE 10-12.5 MG PO TABS: 1.0000 | ORAL_TABLET | Freq: Every day | ORAL | Status: DC

## 2013-06-04 MED ORDER — FENOFIBRATE 54 MG PO TABS
54.0000 mg | ORAL_TABLET | Freq: Every day | ORAL | Status: DC
  Administered 2013-06-05: 10:00:00 54 mg via ORAL
  Filled 2013-06-04: qty 1

## 2013-06-04 MED ORDER — CLOPIDOGREL BISULFATE 75 MG PO TABS
75.0000 mg | ORAL_TABLET | Freq: Every day | ORAL | Status: DC
  Administered 2013-06-05: 10:00:00 75 mg via ORAL
  Filled 2013-06-04: qty 1

## 2013-06-04 MED ORDER — BIVALIRUDIN 250 MG IV SOLR
INTRAVENOUS | Status: AC
  Filled 2013-06-04: qty 250

## 2013-06-04 MED ORDER — VERAPAMIL HCL 2.5 MG/ML IV SOLN
INTRAVENOUS | Status: AC
  Filled 2013-06-04: qty 2

## 2013-06-04 MED ORDER — LISINOPRIL 10 MG PO TABS
10.0000 mg | ORAL_TABLET | Freq: Every day | ORAL | Status: DC
  Administered 2013-06-05: 10 mg via ORAL
  Filled 2013-06-04: qty 1

## 2013-06-04 MED ORDER — POTASSIUM CHLORIDE CRYS ER 20 MEQ PO TBCR
20.0000 meq | EXTENDED_RELEASE_TABLET | Freq: Every day | ORAL | Status: DC
  Administered 2013-06-05: 10:00:00 20 meq via ORAL
  Filled 2013-06-04: qty 1

## 2013-06-04 MED ORDER — LIDOCAINE HCL (PF) 1 % IJ SOLN
INTRAMUSCULAR | Status: AC
  Filled 2013-06-04: qty 30

## 2013-06-04 MED ORDER — SODIUM CHLORIDE 0.9 % IV SOLN
INTRAVENOUS | Status: DC
  Administered 2013-06-04: 1000 mL via INTRAVENOUS

## 2013-06-04 MED ORDER — ACETAMINOPHEN 325 MG PO TABS
650.0000 mg | ORAL_TABLET | ORAL | Status: DC | PRN
  Administered 2013-06-04: 23:00:00 650 mg via ORAL
  Filled 2013-06-04: qty 2

## 2013-06-04 MED ORDER — ALUM & MAG HYDROXIDE-SIMETH 200-200-20 MG/5ML PO SUSP
ORAL | Status: AC
  Filled 2013-06-04: qty 30

## 2013-06-04 MED ORDER — ATORVASTATIN CALCIUM 20 MG PO TABS
20.0000 mg | ORAL_TABLET | Freq: Every day | ORAL | Status: DC
  Administered 2013-06-04: 18:00:00 20 mg via ORAL
  Filled 2013-06-04 (×2): qty 1

## 2013-06-04 NOTE — Progress Notes (Signed)
Utilization Review Completed.   Amritha Yorke, RN, BSN Nurse Case Manager  [phone]  

## 2013-06-04 NOTE — CV Procedure (Signed)
   Cardiac Catheterization Procedure Note  Name: Karen Roach MRN: [id number] DOB: 1961/04/24  Procedure: Left Heart Cath, Selective Coronary Angiography, LV angiography, PTCA and stenting of the RCA  Indication: CCS class III angina, accelerating pattern. Patient with hypertension and hyperlipidemia.  Procedural Details:  The right wrist was prepped, draped, and anesthetized with 1% lidocaine. Using the modified Seldinger technique, a 5/6 French slender sheath was introduced into the right radial artery. 3 mg of verapamil was administered through the sheath, weight-based unfractionated heparin was administered intravenously. Standard Judkins catheters were used for selective coronary angiography and left ventriculography. Catheter exchanges were performed over an exchange length guidewire.  PROCEDURAL FINDINGS Hemodynamics: AO 108/60 LV 105/17   Coronary angiography: Coronary dominance: right  Left mainstem: Patent without significant obstructive disease. Moderate calcification noted. The vessel arises from the left cusp and divides into the LAD and left circumflex. Left anterior descending (LAD): The LAD is heavily calcified. The proximal LAD is widely patent without significant disease. The mid LAD has mild irregularity. The vessel reaches the left ventricular apex. There is no significant obstructive disease throughout. The diagonal branches are patent without significant disease.  Left circumflex (LCx): The left circumflex is patent. There is a 50% eccentric stenosis in the mid vessel. The first OM is large. The second OM is smaller in caliber.  Right coronary artery (RCA): The right coronary artery is severely diseased in its proximal segment. The vessel has severe 90% stenosis followed by 99% subtotal occlusion in the mid vessel with TIMI 1 flow. The distal RCA fills from collaterals supplied by septal perforators of the LAD.  Left ventriculography: Left ventricular systolic  function is normal, LVEF is estimated at 55-65%, there is no significant mitral regurgitation   PCI Note:  Following the diagnostic procedure, the decision was made to proceed with PCI. The radial sheath was upsized to a 6 [place]. Weight-based bivalirudin was given for anticoagulation. Once a therapeutic ACT was achieved, a 6 [place] JR 4 guide catheter was inserted.  A whisper coronary guidewire was used to cross the lesion.  The lesion was predilated with a 2.0 mm balloon. I then attempted to pass a 3.0 x 28 mm stent, but this would not cross. The wire was changed out to a grand slam. The vessel was predilated with a 2.5 mm balloon. The lesion was then stented with a 3.0 x 28 mm Promus premier drug-eluting stent.  The stent was postdilated with a 3.25 mm noncompliant balloon.  Following PCI, there was 0% residual stenosis and TIMI-3 flow. Final angiography confirmed an excellent result. The patient tolerated the procedure well. There were no immediate procedural complications. A TR band was used for radial hemostasis. The patient was transferred to the post catheterization recovery area for further monitoring.  PCI Data: Vessel - RCA/Segment - proximal Percent Stenosis (pre)  99 TIMI-flow 1 Stent 3.0 x 28 mm Promus DES Percent Stenosis (post) 0 TIMI-flow (post) 3  Final Conclusions:   1. Severe single-vessel coronary artery disease with subtotal occlusion of the right coronary artery treated successfully with a drug-eluting stent 2. Diffuse calcific but nonobstructive LAD and left circumflex stenosis 3. Preserved left ventricular systolic function with a left ventricular ejection fraction estimated at 60%   Recommendations:  Dual antiplatelet therapy with aspirin and Plavix for at least 12 months.  Tonny Bollman 06/04/2013, 12:00 PM

## 2013-06-04 NOTE — H&P (View-Only) (Signed)
 Clinical Summary Ms. Popwell is a 52 y.o.female referred for cardiology consultation by Ms. McElroy PA-C with the [facility]. She reports at least a two-month history of progressive exertional chest pain very consistent with angina. She describes a tightness and pressure in her chest with radiation to left arm, occasionally tingling in her jaw. This has been occurring more frequently with exertion. She states that she has been compliant with her medications outlined below. She has not undergone any prior ischemic workup.  Recent lab work revealed potassium 4.3, BUN 20, creatinine 0.9, AST 43, ALT 54, cholesterol 155, triglycerides 106, HDL 34, LDL 80, hemoglobin A1c 6. ECG shows sinus rhythm with nonspecific T-wave flattening.   Allergies  Allergen Reactions  . Ciprofloxacin Itching  . Demerol [Meperidine] Itching  . Tramadol Nausea And Vomiting    Current Outpatient Prescriptions  Medication Sig Dispense Refill  . aspirin 325 MG tablet Take 325 mg by mouth daily.      . calcium carbonate (CALCIUM 600) 600 MG TABS tablet Take 600 mg by mouth daily.      . Choline Fenofibrate (TRILIPIX) 135 MG capsule Take 135 mg by mouth daily.      . Cyanocobalamin (VITAMIN B-12 PO) Take by mouth.      . fish oil-omega-3 fatty acids 1000 MG capsule Take 2 g by mouth daily.      . metoprolol succinate (TOPROL-XL) 100 MG 24 hr tablet Take 100 mg by mouth daily. Take with or immediately following a meal.      . Multiple Vitamin (MULTIVITAMIN) tablet Take 1 tablet by mouth daily.      . Potassium 95 MG TABS Take 1 tablet by mouth daily.      . Quinapril-Hydrochlorothiazide (ACCURETIC PO) Take 10 mg by mouth as directed.       No current facility-administered medications for this visit.    Past Medical History  Diagnosis Date  . GERD (gastroesophageal reflux disease)   . Essential hypertension   . Mixed hyperlipidemia   . Arthritis   . Palpitations     Poossible PVCs based on  description    Past Surgical History  Procedure Laterality Date  . Tubal ligation    . Ganglion cyst resection    . Carpal total release    . Rectocele repair      Family History  Problem Relation Age of Onset  . Colon cancer Father   . Hypertension Mother     Social History Ms. Achterberg reports that she has never smoked. She does not have any smokeless tobacco history on file. Ms. Malmberg reports that she does not drink alcohol.  Review of Systems Reports occasional brief palpitations, no dizziness or syncope. No reported bleeding problems. She has stiffness in her fingers in the mornings. No orthopnea or PND. Occasional ankle and lower leg edema. Otherwise negative.  Physical Examination Filed Vitals:   06/02/13 1448  BP: 143/89  Pulse: 85   Filed Weights   06/02/13 1448  Weight: 195 lb 6.4 oz (88.633 kg)   Appears comfortable at rest. HEENT: Conjunctiva and lids normal, oropharynx clear. Neck: Supple, no elevated JVP or carotid bruits, no thyromegaly. Lungs: Clear to auscultation, nonlabored breathing at rest. Cardiac: Regular rate and rhythm, no S3 or significant systolic murmur, no pericardial rub. Abdomen: Soft, nontender, bowel sounds present, no guarding or rebound. Extremities: Trace edema, distal pulses 2+. Skin: Warm and dry. Musculoskeletal: No kyphosis. Neuropsychiatric: Alert and oriented x3, affect grossly appropriate.     Problem List and Plan   Accelerating angina Symptom description very consistent with accelerating angina. Risk factors include hypertension, hyperlipidemia. Symptom onset within the last 2 months. Recent ECGs reviewed and nonspecific. We have discussed options for diagnostic evaluation, plan to proceed with a diagnostic cardiac catheterization with eye toward revascularization if needed.  Essential hypertension, benign Blood pressure mildly increased today, she reports compliance with her medications.  Mixed hyperlipidemia On  Lipitor, recent LDL 80.    Samuel G. McDowell, M.D., F.A.C.C.   

## 2013-06-04 NOTE — Interval H&P Note (Signed)
History and Physical Interval Note:  06/04/2013 10:36 AM  Karen Roach  has presented today for surgery, with the diagnosis of [place]  The various methods of treatment have been discussed with the patient and family. After consideration of risks, benefits and other options for treatment, the patient has consented to  Procedure(s): LEFT HEART CATHETERIZATION WITH CORONARY ANGIOGRAM (N/A) as a surgical intervention .  The patient's history has been reviewed, patient examined, no change in status, stable for surgery.  I have reviewed the patient's chart and labs.  Questions were answered to the patient's satisfaction.    Cath Lab Visit (complete for each Cath Lab visit)  Clinical Evaluation Leading to the Procedure:   ACS: no  Non-ACS:    Anginal Classification: CCS III  Anti-ischemic medical therapy: Minimal Therapy (1 class of medications)  Non-Invasive Test Results: No non-invasive testing performed  Prior CABG: No previous CABG         Tonny Bollman

## 2013-06-05 ENCOUNTER — Encounter (HOSPITAL_COMMUNITY): Payer: Self-pay | Admitting: Nurse Practitioner

## 2013-06-05 DIAGNOSIS — I200 Unstable angina: Secondary | ICD-10-CM

## 2013-06-05 DIAGNOSIS — I2510 Atherosclerotic heart disease of native coronary artery without angina pectoris: Secondary | ICD-10-CM

## 2013-06-05 LAB — BASIC METABOLIC PANEL
CO2: 26 mEq/L (ref 19–32)
Chloride: 101 mEq/L (ref 96–112)
Creatinine, Ser: 0.74 mg/dL (ref 0.50–1.10)
GFR calc Af Amer: >90 mL/min (ref >90)
Sodium: 135 mEq/L (ref 135–145)

## 2013-06-05 LAB — CBC
MCV: 85.3 fL (ref 78.0–100.0)
Platelets: 205 10*3/uL (ref 150–400)
RBC: 4.29 MIL/uL (ref 3.87–5.11)
RDW: 12.9 % (ref 11.5–15.5)
WBC: 7.1 10*3/uL (ref 4.0–10.5)

## 2013-06-05 MED ORDER — NITROGLYCERIN 0.4 MG SL SUBL: 0.4000 mg | SUBLINGUAL_TABLET | SUBLINGUAL | Status: AC | PRN

## 2013-06-05 MED ORDER — ASPIRIN 81 MG PO TABS: 81.0000 mg | ORAL_TABLET | Freq: Every day | ORAL | Status: AC

## 2013-06-05 MED ORDER — CLOPIDOGREL BISULFATE 75 MG PO TABS: 75.0000 mg | ORAL_TABLET | Freq: Every day | ORAL | Status: DC

## 2013-06-05 MED FILL — Dextrose Inj 5%: INTRAVENOUS | Qty: 1000 | Status: AC

## 2013-06-05 NOTE — Discharge Summary (Signed)
And and and and and and and and in an I and and and and and a Patient ID: Karen Roach,  MRN: [id number], DOB/AGE: 1960-11-21 52 y.o.  Admit date: 06/04/2013 Discharge date: 06/05/2013  Primary Care Provider: Noemi Chapel, PA-C Primary Cardiologist: Ival Bible, MD   Discharge Diagnoses Principal Problem:   Unstable angina  **s/p cath and PCI/DES of the RCA this admission.  Active Problems:   CAD (coronary artery disease)   Essential hypertension, benign   Mixed hyperlipidemia  Allergies Allergies  Allergen Reactions  . Ciprofloxacin Itching  . Demerol [Meperidine] Itching  . Tramadol Nausea And Vomiting   Procedures  Cardiac Catheterization and Percutaneous Coronary Intervention 8.21.2014  PROCEDURAL FINDINGS Hemodynamics: AO 108/60 LV 105/17              Coronary angiography: Coronary dominance: right  Left mainstem: Patent without significant obstructive disease. Moderate calcification noted. The vessel arises from the left cusp and divides into the LAD and left circumflex. Left anterior descending (LAD): The LAD is heavily calcified. The proximal LAD is widely patent without significant disease. The mid LAD has mild irregularity. The vessel reaches the left ventricular apex. There is no significant obstructive disease throughout. The diagonal branches are patent without significant disease. Left circumflex (LCx): The left circumflex is patent. There is a 50% eccentric stenosis in the mid vessel. The first OM is large. The second OM is smaller in caliber. Right coronary artery (RCA): The right coronary artery is severely diseased in its proximal segment. The vessel has severe 90% stenosis followed by 99% subtotal occlusion in the mid vessel with TIMI 1 flow. The distal RCA fills from collaterals supplied by septal perforators of the LAD.   **The RCA was successfully stented using a 3.0 x 28 mm Promus DES**  Left ventriculography: Left ventricular systolic function is  normal, LVEF is estimated at 55-65%, there is no significant mitral regurgitation   Final Conclusions:   1. Severe single-vessel coronary artery disease with subtotal occlusion of the right coronary artery treated successfully with a drug-eluting stent 2. Diffuse calcific but nonobstructive LAD and left circumflex stenosis 3. Preserved left ventricular systolic function with a left ventricular ejection fraction estimated at 60%   Recommendations:  Dual antiplatelet therapy with aspirin and Plavix for at least 12 months. _____________   History of Present Illness  52 year old female without prior cardiac history who was recently seen in cardiology consultation as an outpatient secondary to new onset chest pressure with radiation to the left arm. Symptoms were felt to be concerning for angina and as a result, cardiac catheterization was scheduled.  Hospital Course  Patient presented to the [facility] cardiac catheterization laboratory on August 21 and underwent anoscopy catheterization revealing severe stenosis in the mid right coronary artery. He otherwise had nonobstructive coronary artery disease and normal left ventricular function. The right coronary artery was successfully stented using a 3.0 x 28 mm Promus drug-eluting stent. Patient tolerated procedure well and post procedure has been ambulating without recurrent symptoms or limitations. She'll be discharged home today in good condition.  Discharge Vitals Blood pressure 114/57, pulse 71, temperature 98.3 F (36.8 C), temperature source Oral, resp. rate 16, height 5\' 6"  (1.676 m), weight 196 lb 10.4 oz (89.2 kg), SpO2 98.00%.  Filed Weights   06/04/13 0713 06/05/13 0447  Weight: 195 lb (88.451 kg) 196 lb 10.4 oz (89.2 kg)   Labs  CBC  Recent Labs  06/02/13 1525 06/05/13 0505  WBC 7.2 7.1  NEUTROABS 3.5  --   HGB 12.7 12.5  HCT 38.4 36.6  MCV 84.4 85.3  PLT 276 205   Basic Metabolic Panel  Recent Labs  06/02/13 1525  06/05/13 0505  NA 139 135  K 4.1 3.8  CL 104 101  CO2 29 26  GLUCOSE 92 115*  BUN 19 17  CREATININE 0.85 0.74  CALCIUM 9.8 8.8   Disposition  Pt is being discharged home today in good condition.  Follow-up Plans & Appointments      Follow-up Information   Follow up with Joni Reining, NP On 06/19/2013. (2:00 PM)    Specialty:  Nurse Practitioner   Contact information:   [address] [place] [address] [phone]       Follow up with Willow Ora, PA-C. (as scheduled.)    Specialty:  Physician Assistant   Contact information:   [facility] [address] [place] [address] [phone]      Discharge Medications    Medication List         aspirin 81 MG tablet  Take 1 tablet (81 mg total) by mouth daily.     CALCIUM 600 600 MG Tabs tablet  Generic drug:  calcium carbonate  Take 600 mg by mouth daily.     clopidogrel 75 MG tablet  Commonly known as:  PLAVIX  Take 1 tablet (75 mg total) by mouth daily with breakfast.     fish oil-omega-3 fatty acids 1000 MG capsule  Take 2 g by mouth 2 (two) times daily.     metoprolol succinate 100 MG 24 hr tablet  Commonly known as:  TOPROL-XL  Take 100 mg by mouth daily. Take with or immediately following a meal.     multivitamin tablet  Take 1 tablet by mouth daily.     nitroGLYCERIN 0.4 MG SL tablet  Commonly known as:  NITROSTAT  Place 1 tablet (0.4 mg total) under the tongue every 5 (five) minutes as needed for chest pain.     Potassium 95 MG Tabs  Take 1 tablet by mouth daily.     quinapril-hydrochlorothiazide 10-12.5 MG per tablet  Commonly known as:  ACCURETIC  Take 1 tablet by mouth daily.     rosuvastatin 10 MG tablet  Commonly known as:  CRESTOR  Take 10 mg by mouth daily.     TRILIPIX 135 MG capsule  Generic drug:  Choline Fenofibrate  Take 135 mg by mouth daily.     VITAMIN B-12 PO  Take 1 tablet by mouth daily.      Outstanding  Labs/Studies  None  Duration of Discharge Encounter   Greater than 30 minutes including physician time.  Signed, Nicolasa Ducking NP 06/05/2013, 12:40 PM

## 2013-06-05 NOTE — Progress Notes (Signed)
CARDIAC REHAB PHASE I   PRE:  Rate/Rhythm: 76 SR    BP: sitting 117/69    SaO2:   MODE:  Ambulation: 550 ft   POST:  Rate/Rhythm: 98 SR    BP: sitting 135/79     SaO2:   Tolerated well, feels good. Good ed with reception. Discussed pre-DM as well. Pt is motivated to improve her health. Interested in [facility] and will send referral to [facility]. Will need financial assist, which gave her application. [phone]   Harriet Masson CES, ACSM 06/05/2013 9:07 AM

## 2013-06-05 NOTE — Progress Notes (Signed)
    Subjective:  Feels good this am. No CP or dyspnea.   Objective:  Vital Signs in the last 24 hours: Temp:  [97.7 F (36.5 C)-98.6 F (37 C)] 97.7 F (36.5 C) (08/22 0723) Pulse Rate:  [65-81] 72 (08/22 0723) Resp:  [16-18] 16 (08/22 0723) BP: (112-153)/(57-95) 153/62 mmHg (08/22 0723) SpO2:  [95 %-98 %] 98 % (08/22 0723) Weight:  [196 lb 10.4 oz (89.2 kg)] 196 lb 10.4 oz (89.2 kg) (08/22 0447)  Intake/Output from previous day: 08/21 0701 - 08/22 0700 In: 948 [P.O.:240; I.V.:708] Out: 1500 [Urine:1500]  Physical Exam: Pt is alert and oriented, NAD HEENT: normal Neck: JVP - normal Lungs: CTA bilaterally CV: RRR without murmur or gallop Abd: soft, NT, Positive BS, no hepatomegaly Ext: no C/C/E, distal pulses intact and equal, right radial site clear Skin: warm/dry no rash   Lab Results:  Recent Labs  06/02/13 1525 06/05/13 0505  WBC 7.2 7.1  HGB 12.7 12.5  PLT 276 205    Recent Labs  06/02/13 1525 06/05/13 0505  NA 139 135  K 4.1 3.8  CL 104 101  CO2 29 26  GLUCOSE 92 115*  BUN 19 17  CREATININE 0.85 0.74   No results found for this basename: TROPONINI, CK, MB,  in the last 72 hours  Tele: Sinus rhythm, personally reviewed  Assessment/Plan:  1. CAD with CCS Class 3 angina, s/p PCI of the RCA with a drug-eluting stent. ASA/plavix x 12 months. Follow-up with Dr Diona Browner or PA/NP within 2 weeks. Thx.  2. Hyperlipidemia - on crestor  3. HTN - on metoprolol succinate, quinapril, HCTZ. BP controlled.  Ok for discharge this am. Would like 30 day and 90 day Rx for plavix.    Tonny Bollman, M.D. 06/05/2013, 7:28 AM

## 2013-06-19 ENCOUNTER — Encounter: Payer: Self-pay | Admitting: Adult Health

## 2013-06-19 ENCOUNTER — Ambulatory Visit (INDEPENDENT_AMBULATORY_CARE_PROVIDER_SITE_OTHER): Payer: Medicaid Other | Admitting: Adult Health

## 2013-06-19 VITALS — BP 129/79 | HR 77 | Ht 66.0 in | Wt 190.0 lb

## 2013-06-19 DIAGNOSIS — E782 Mixed hyperlipidemia: Secondary | ICD-10-CM

## 2013-06-19 DIAGNOSIS — I10 Essential (primary) hypertension: Secondary | ICD-10-CM

## 2013-06-19 DIAGNOSIS — I2510 Atherosclerotic heart disease of native coronary artery without angina pectoris: Secondary | ICD-10-CM

## 2013-06-19 NOTE — Patient Instructions (Signed)
Your physician recommends that you schedule a follow-up appointment in: 4 months You will receive a reminder letter two months in advance reminding you to call and schedule your appointment. If you don't receive this letter, please contact our office.  Do not take NAPROXEN SODIUM.

## 2013-06-19 NOTE — Progress Notes (Signed)
HPI:  Karen Roach is a 52 year old patient of Dr. Diona Browner we are following for ongoing assessment and management of exertional chest pain, hypertension, with history of hyperlipidemia. Symptoms were suggestive of angina.     The patient was sent for cardiac catheterization which was completed on 06/04/2013,demonstrating severe single-vessel CAD with subtotal occlusion of the right coronary artery treated successfully with a drug-eluting stent. The patient had diffuse calcific but nonobstructive LAD and left circumflex stenosis. She had preserved LV function at 60%.      She was started on dual antiplatelet therapy with aspirin and Plavix for 12 months. She was placed on metoprolol 100 mg daily given prescription for nitroglycerin sublingual.     She comes today without any cardiac complaints. She has been noticing some warmth in her chest on occasion, and also some warmth in her lower strategies. She denies any frank chest pain. She is beginning a walking program, although slowly. She has lost 6 pounds on a heart healthy diet. She has had no need to use nitroglycerin.    Allergies  Allergen Reactions  . Ciprofloxacin Itching  . Demerol [Meperidine] Itching  . Tramadol Nausea And Vomiting    Current Outpatient Prescriptions  Medication Sig Dispense Refill  . aspirin 81 MG tablet Take 1 tablet (81 mg total) by mouth daily.      . calcium carbonate (CALCIUM 600) 600 MG TABS tablet Take 600 mg by mouth daily.      . Choline Fenofibrate (TRILIPIX) 135 MG capsule Take 135 mg by mouth daily.      . clopidogrel (PLAVIX) 75 MG tablet Take 1 tablet (75 mg total) by mouth daily with breakfast.  90 tablet  3  . Cyanocobalamin (VITAMIN B-12 PO) Take 1 tablet by mouth daily.       . fish oil-omega-3 fatty acids 1000 MG capsule Take 2 g by mouth 2 (two) times daily.       . metoprolol succinate (TOPROL-XL) 100 MG 24 hr tablet Take 100 mg by mouth daily. Take with or immediately following a meal.      .  Multiple Vitamin (MULTIVITAMIN) tablet Take 1 tablet by mouth daily.      . nitroGLYCERIN (NITROSTAT) 0.4 MG SL tablet Place 1 tablet (0.4 mg total) under the tongue every 5 (five) minutes as needed for chest pain.  25 tablet  3  . Potassium 95 MG TABS Take 1 tablet by mouth daily.      . quinapril-hydrochlorothiazide (ACCURETIC) 10-12.5 MG per tablet Take 1 tablet by mouth daily.      . rosuvastatin (CRESTOR) 10 MG tablet Take 10 mg by mouth daily.       No current facility-administered medications for this visit.    Past Medical History  Diagnosis Date  . GERD (gastroesophageal reflux disease)   . Essential hypertension   . Mixed hyperlipidemia   . Palpitations     Poossible PVCs based on description  . Coronary artery disease     a. 05/2013 Cath/PCI: LM nl, LAD min irregs, LCX 25m, OM1 large/nl, OM2 sm/nl, RCA 90p/1m (3.0x28 Promus Premier), EF 60%.  . Migraines   . Arthritis     "tx'd for 20 yr; new dr says I probably don't have it" (06/04/2013)    Past Surgical History  Procedure Laterality Date  . Tubal ligation  05/1999  . Ganglion cyst excision  ~ 1977  . Carpal tunnel release Bilateral 1990's  . Rectocele repair  ~ 2009  . Coronary  angioplasty with stent placement  2013-06-08    "1" (2013-06-08)  . Dilation and curettage of uterus  1983-02-26    "baby died in my stomach" (06/08/2013)    WJX:BJYNWG of systems complete and found to be negative unless listed above  PHYSICAL EXAM BP 129/79  Pulse 77  Ht 5\' 6"  (1.676 m)  Wt 190 lb (86.183 kg)  BMI 30.68 kg/m2  General: Well developed, well nourished, in no acute distress Head: Eyes PERRLA, No xanthomas.   Normal cephalic and atramatic  Lungs: Clear bilaterally to auscultation and percussion. Heart: HRRR S1 S2, without MRG.  Pulses are 2+ & equal.            No carotid bruit. No JVD.  No abdominal bruits. No femoral bruits. Abdomen: Bowel sounds are positive, abdomen soft and non-tender without masses or                   Hernia's noted. Msk:  Back normal, normal gait. Normal strength and tone for age. Extremities: No clubbing, cyanosis or edema. Right wrist cath insertion site is healthy.   DP +1 Neuro: Alert and oriented X 3. Psych:  Good affect, responds appropriately  EKG: NSR rate of 77 bpm.  ASSESSMENT AND PLAN

## 2013-06-19 NOTE — Assessment & Plan Note (Signed)
Doing well from a cardiovascular standpoint. She is medically compliant, offers no cardiac complaint, has lost 6 pounds on a heart healthy diet, has begun a walking program. I congratulated her on her lifestyle changes. She has not had use nitroglycerin sublingual. I have advised that she begin her walking firm slowly. She is going back to her normal daily activities has not had any issues with weakness fatigue or dyspnea. We will see her again in 4 months unless she becomes symptomatic. Labs are drawn by the [facility] free clinic concerning lipids and LFTs.

## 2013-06-19 NOTE — Progress Notes (Deleted)
Name: Karen Roach    DOB: 09-17-1961  Age: 52 y.o.  MR#: [id number]       PCP:  Provider Not In System      Insurance: Payor: MEDICAID POTENTIAL / Plan: MEDICAID POTENTIAL / Product Type: *No Product type* /   CC:    Chief Complaint  Patient presents with  . Coronary Artery Disease    VS Filed Vitals:   06/19/13 1417  BP: 129/79  Pulse: 77  Height: 5\' 6"  (1.676 m)  Weight: 190 lb (86.183 kg)    Weights Current Weight  06/19/13 190 lb (86.183 kg)  06/05/13 196 lb 10.4 oz (89.2 kg)  06/05/13 196 lb 10.4 oz (89.2 kg)    Blood Pressure  BP Readings from Last 3 Encounters:  06/19/13 129/79  06/05/13 114/57  06/05/13 114/57     Admit date:  (Not on file) Last encounter with RMR:  Visit date not found   Allergy Ciprofloxacin; Demerol; and Tramadol  Current Outpatient Prescriptions  Medication Sig Dispense Refill  . aspirin 81 MG tablet Take 1 tablet (81 mg total) by mouth daily.      . calcium carbonate (CALCIUM 600) 600 MG TABS tablet Take 600 mg by mouth daily.      . Choline Fenofibrate (TRILIPIX) 135 MG capsule Take 135 mg by mouth daily.      . clopidogrel (PLAVIX) 75 MG tablet Take 1 tablet (75 mg total) by mouth daily with breakfast.  90 tablet  3  . Cyanocobalamin (VITAMIN B-12 PO) Take 1 tablet by mouth daily.       . fish oil-omega-3 fatty acids 1000 MG capsule Take 2 g by mouth 2 (two) times daily.       . metoprolol succinate (TOPROL-XL) 100 MG 24 hr tablet Take 100 mg by mouth daily. Take with or immediately following a meal.      . Multiple Vitamin (MULTIVITAMIN) tablet Take 1 tablet by mouth daily.      . nitroGLYCERIN (NITROSTAT) 0.4 MG SL tablet Place 1 tablet (0.4 mg total) under the tongue every 5 (five) minutes as needed for chest pain.  25 tablet  3  . Potassium 95 MG TABS Take 1 tablet by mouth daily.      . quinapril-hydrochlorothiazide (ACCURETIC) 10-12.5 MG per tablet Take 1 tablet by mouth daily.      . rosuvastatin (CRESTOR) 10 MG tablet Take  10 mg by mouth daily.       No current facility-administered medications for this visit.    Discontinued Meds:   There are no discontinued medications.  Patient Active Problem List   Diagnosis Date Noted  . Unstable angina 06/05/2013  . CAD (coronary artery disease) 06/05/2013  . Accelerating angina 06/02/2013  . Mixed hyperlipidemia 06/02/2013  . Essential hypertension, benign 06/01/2013    LABS    Component Value Date/Time   NA 135 06/05/2013 0505   NA 139 06/02/2013 1525   NA 137 11/07/2009 1145   K 3.8 06/05/2013 0505   K 4.1 06/02/2013 1525   K 4.0 11/07/2009 1145   CL 101 06/05/2013 0505   CL 104 06/02/2013 1525   CL 101 11/07/2009 1145   CO2 26 06/05/2013 0505   CO2 29 06/02/2013 1525   CO2 29 11/07/2009 1145   GLUCOSE 115* 06/05/2013 0505   GLUCOSE 92 06/02/2013 1525   GLUCOSE 106* 11/07/2009 1145   BUN 17 06/05/2013 0505   BUN 19 06/02/2013 1525   BUN 20 11/07/2009 1145  CREATININE 0.74 06/05/2013 0505   CREATININE 0.85 06/02/2013 1525   CREATININE 0.93 11/07/2009 1145   CREATININE 0.59 09/08/2008 1029   CALCIUM 8.8 06/05/2013 0505   CALCIUM 9.8 06/02/2013 1525   CALCIUM 9.5 11/07/2009 1145   GFRNONAA >90 06/05/2013 0505   GFRNONAA >60 11/07/2009 1145   GFRNONAA >60 09/08/2008 1029   GFRAA >90 06/05/2013 0505   GFRAA  Value: >60        The eGFR has been calculated using the MDRD equation. This calculation has not been validated in all clinical situations. eGFR's persistently <60 mL/min signify possible Chronic Kidney Disease. 11/07/2009 1145   GFRAA  Value: >60        The eGFR has been calculated using the MDRD equation. This calculation has not been validated in all clinical 09/08/2008 1029   CMP     Component Value Date/Time   NA 135 06/05/2013 0505   K 3.8 06/05/2013 0505   CL 101 06/05/2013 0505   CO2 26 06/05/2013 0505   GLUCOSE 115* 06/05/2013 0505   BUN 17 06/05/2013 0505   CREATININE 0.74 06/05/2013 0505   CREATININE 0.85 06/02/2013 1525   CALCIUM 8.8 06/05/2013 0505    PROT 7.5 11/07/2009 1145   ALBUMIN 4.1 11/07/2009 1145   AST 23 11/07/2009 1145   ALT 28 11/07/2009 1145   ALKPHOS 22* 11/07/2009 1145   BILITOT 0.6 11/07/2009 1145   GFRNONAA >90 06/05/2013 0505   GFRAA >90 06/05/2013 0505       Component Value Date/Time   WBC 7.1 06/05/2013 0505   WBC 7.2 06/02/2013 1525   WBC 6.4 11/07/2009 1145   HGB 12.5 06/05/2013 0505   HGB 12.7 06/02/2013 1525   HGB 14.1 11/07/2009 1145   HCT 36.6 06/05/2013 0505   HCT 38.4 06/02/2013 1525   HCT 40.9 11/07/2009 1145   MCV 85.3 06/05/2013 0505   MCV 84.4 06/02/2013 1525   MCV 87.8 11/07/2009 1145    Lipid Panel  No results found for this basename: chol, trig, hdl, cholhdl, vldl, ldlcalc    ABG No results found for this basename: phart, pco2, pco2art, po2, po2art, hco3, tco2, acidbasedef, o2sat     No results found for this basename: TSH   BNP (last 3 results) No results found for this basename: PROBNP,  in the last 8760 hours Cardiac Panel (last 3 results) No results found for this basename: CKTOTAL, CKMB, TROPONINI, RELINDX,  in the last 72 hours  Iron/TIBC/Ferritin No results found for this basename: iron, tibc, ferritin     EKG Orders placed during the hospital encounter of 06/04/13  . EKG 12-LEAD  . EKG 12-LEAD  . EKG 12-LEAD  . EKG 12-LEAD  . EKG 12-LEAD  . EKG 12-LEAD  . EKG 12-LEAD  . EKG 12-LEAD  . EKG 12-LEAD  . EKG 12-LEAD  . EKG     Prior Assessment and Plan Problem List as of 06/19/2013     Cardiovascular and Mediastinum   Essential hypertension, benign   Last Assessment & Plan   06/02/2013 Office Visit Written 06/02/2013  3:40 PM by Jonelle Sidle, MD     Blood pressure mildly increased today, she reports compliance with her medications.    Accelerating angina   Last Assessment & Plan   06/02/2013 Office Visit Written 06/02/2013  3:39 PM by Jonelle Sidle, MD     Symptom description very consistent with accelerating angina. Risk factors include hypertension, hyperlipidemia.  Symptom onset within the last 2 months.  Recent ECGs reviewed and nonspecific. We have discussed options for diagnostic evaluation, plan to proceed with a diagnostic cardiac catheterization with eye toward revascularization if needed.    Unstable angina   CAD (coronary artery disease)     Other   Mixed hyperlipidemia   Last Assessment & Plan   06/02/2013 Office Visit Written 06/02/2013  3:40 PM by Jonelle Sidle, MD     On Lipitor, recent LDL 80.        Imaging: Dg Chest 2 View  06/02/2013   *RADIOLOGY REPORT*  Clinical Data: Preop for cardiac catheterization, chest pain, shortness of breath  CHEST - 2 VIEW  Comparison: Chest x-ray of 11/07/2009  Findings: No active infiltrate or effusion is seen.  Mediastinal contours are stable.  The heart is within normal limits in size. There are degenerative changes throughout the thoracic spine.  IMPRESSION: No active lung disease.   Original Report Authenticated By: Dwyane Dee, M.D.

## 2013-06-19 NOTE — Assessment & Plan Note (Signed)
Excellent control of blood pressure currently. A warming no changes at this time. Karen Roach is no cough or tickling in her throat with use of ACE inhibitor. She is not complaining of any leg cramping with use of HCTZ. Labs are completed the [facility] free clinic. We will see her again in 4 months unless she becomes symptomatic.

## 2013-06-19 NOTE — Addendum Note (Signed)
Addended by: Derry Lory A on: 06/19/2013 04:07 PM   Modules accepted: Orders

## 2013-06-19 NOTE — Assessment & Plan Note (Signed)
She has started a heart healthy diet. She does not eat fast food or fried foods. I congratulated her on her diet changes weight loss and encouraged her to continue this healthy lifestyle change.

## 2013-09-24 IMAGING — CR DG CHEST 2V
2 series · 2 of 2 positions shown · non-contrast
Comparison: Chest x-ray of 11/07/2009

CLINICAL DATA: Preop for cardiac catheterization, chest pain,
shortness of breath

CHEST - 2 VIEW

[view not recorded (1 of 2)]
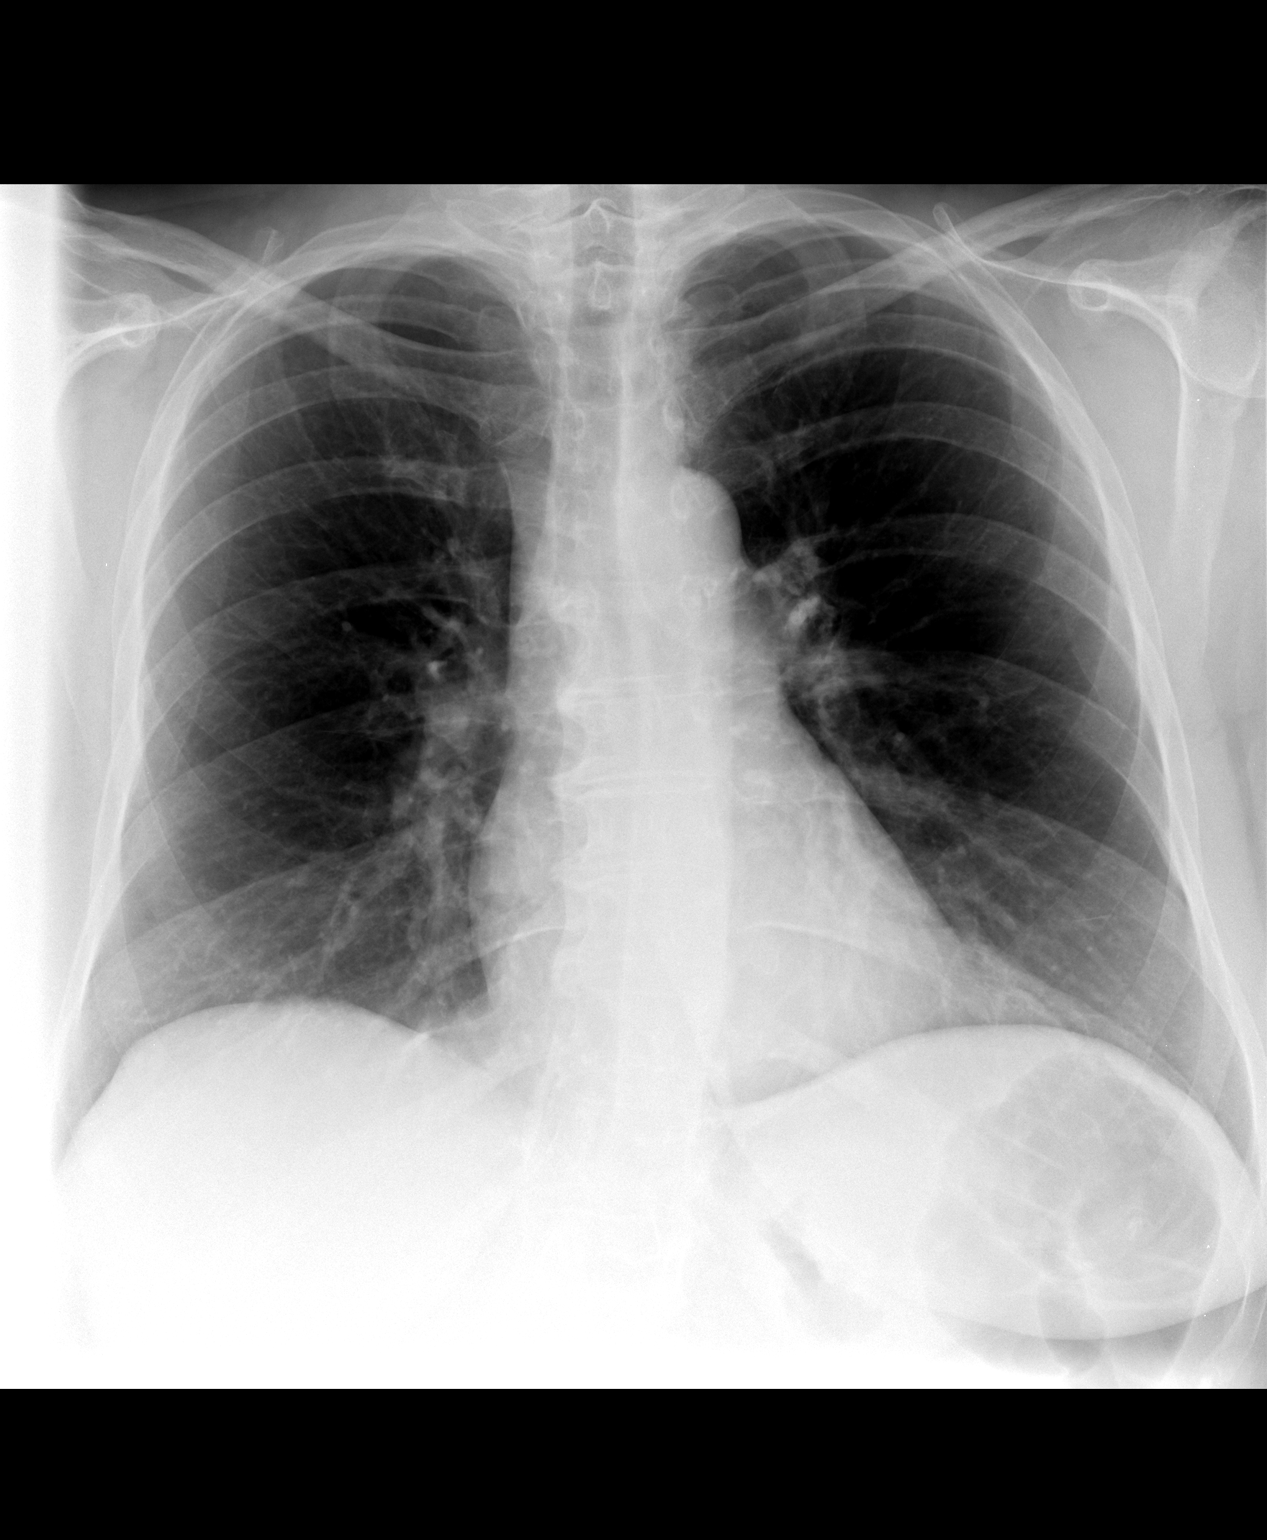

[view not recorded (2 of 2)]
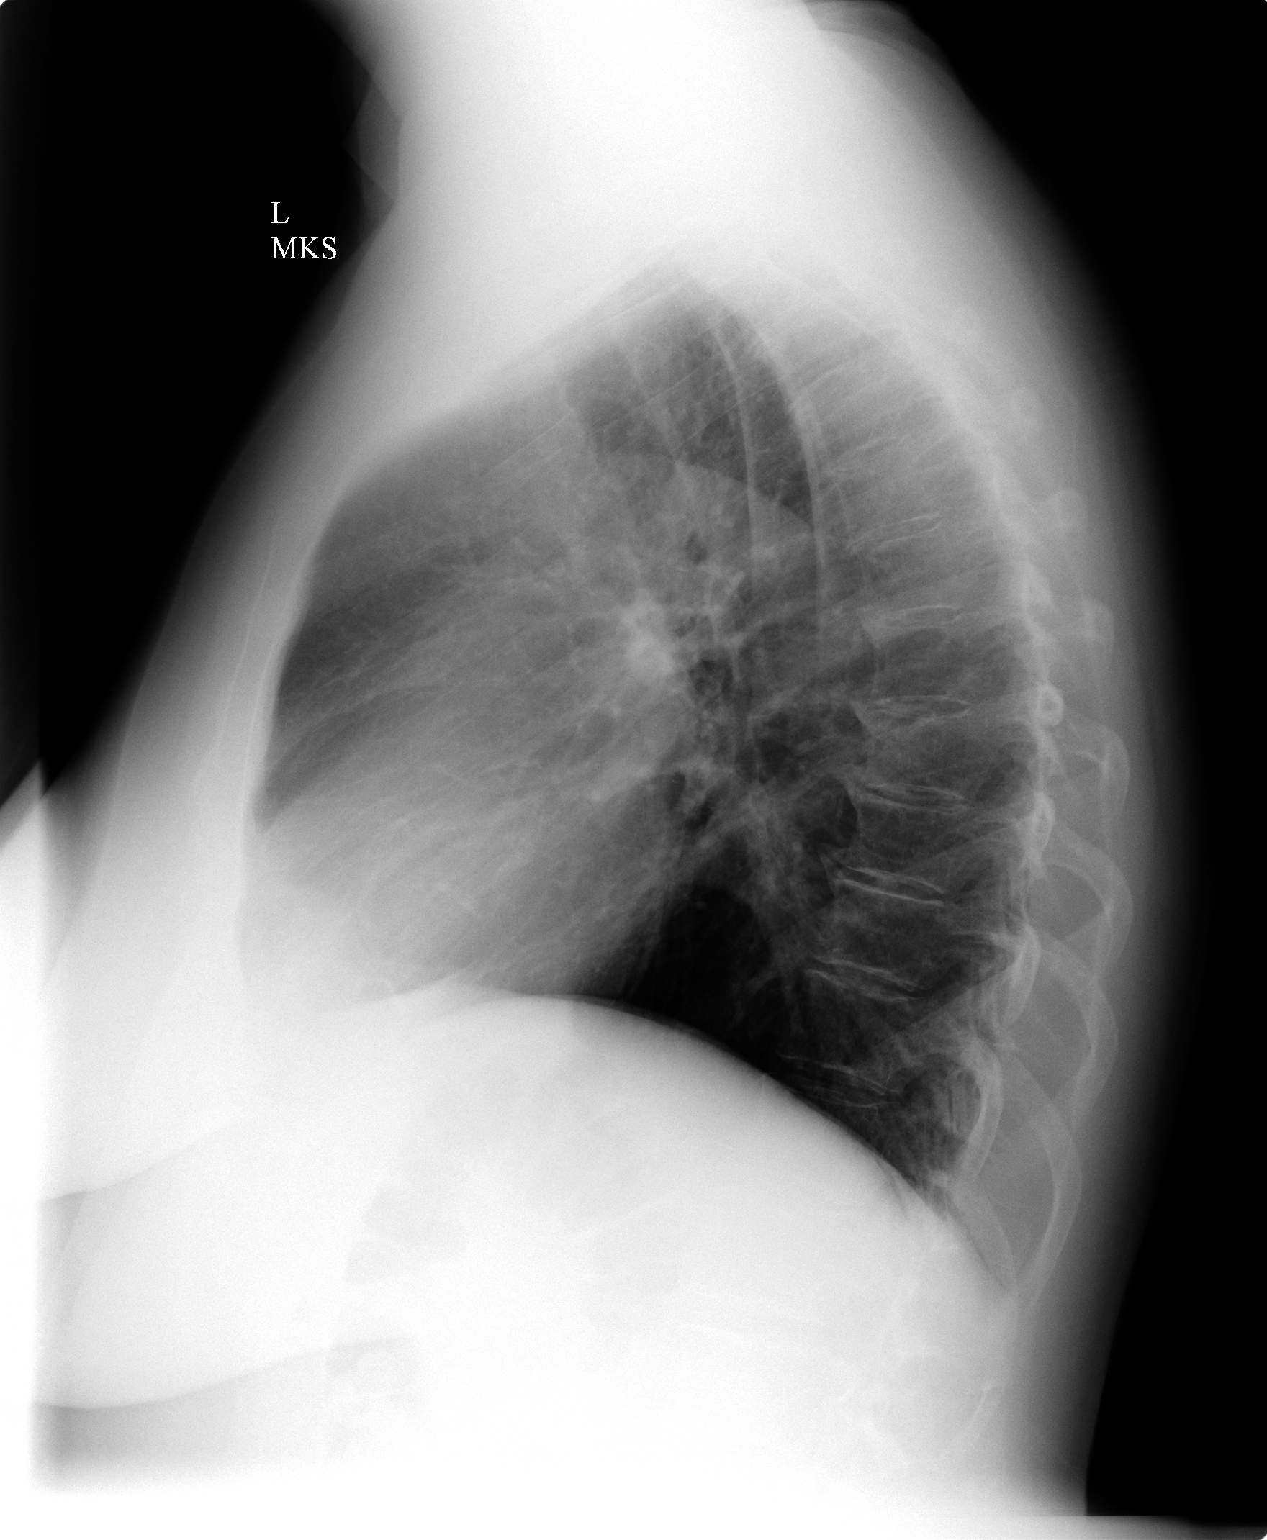

[2 of 2 positions shown; findings below may reference images not displayed]

FINDINGS: No active infiltrate or effusion is seen.  Mediastinal
contours are stable.  The heart is within normal limits in size.
There are degenerative changes throughout the thoracic spine.
IMPRESSION: No active lung disease.

## 2013-11-19 ENCOUNTER — Telehealth: Payer: Self-pay | Admitting: Cardiology

## 2013-11-19 NOTE — Telephone Encounter (Signed)
Patient needs to have tooth pulled and needs to know if she can due to having stent.  Please leave message on machine stating whether this is ok or not. / tgs

## 2013-11-19 NOTE — Telephone Encounter (Signed)
LM that pt can have tooth pulled even though she has stent

## 2014-04-01 ENCOUNTER — Other Ambulatory Visit: Payer: Self-pay

## 2014-04-01 MED ORDER — CLOPIDOGREL BISULFATE 75 MG PO TABS: 75.0000 mg | ORAL_TABLET | Freq: Every day | ORAL | Status: DC

## 2014-04-06 ENCOUNTER — Other Ambulatory Visit (HOSPITAL_COMMUNITY): Payer: Self-pay | Admitting: Cardiovascular Disease

## 2014-04-07 ENCOUNTER — Telehealth: Payer: Self-pay | Admitting: Cardiology

## 2014-04-07 NOTE — Telephone Encounter (Signed)
To soon for refill, refilled on 6/23

## 2014-04-07 NOTE — Telephone Encounter (Signed)
Clopidogrel 75 mg to [facility] / tgs

## 2014-04-08 ENCOUNTER — Telehealth: Payer: Self-pay

## 2014-04-11 NOTE — Telephone Encounter (Signed)
She only needs to take plavix through August 2015, then can stop. I would not recommend changing to effient at this point as she is approaching 12 months out from her stent.

## 2014-04-12 NOTE — Telephone Encounter (Signed)
I contacted the pt's pharmacy but they did not initiate this phone call. They stated that the pt does not have insurance and they price match the pt's plavix and it is $20.  The pt did pick up her prescription for plavix last week. I left a message at the pt's home to contact our office.

## 2014-04-13 NOTE — Telephone Encounter (Signed)
I spoke with the pt and she is taking plavix as prescribed. I made her aware that she can stop this medication after August but must continue ASA 81mg  daily. The pt is scheduled to follow-up with Dr Diona BrownerMcDowell 04/23/14.

## 2014-04-23 ENCOUNTER — Ambulatory Visit (INDEPENDENT_AMBULATORY_CARE_PROVIDER_SITE_OTHER): Payer: Medicaid Other | Admitting: Cardiology

## 2014-04-23 ENCOUNTER — Encounter: Payer: Self-pay | Admitting: Cardiology

## 2014-04-23 VITALS — BP 132/82 | HR 73 | Ht 66.0 in | Wt 196.0 lb

## 2014-04-23 DIAGNOSIS — I209 Angina pectoris, unspecified: Secondary | ICD-10-CM

## 2014-04-23 DIAGNOSIS — I25119 Atherosclerotic heart disease of native coronary artery with unspecified angina pectoris: Secondary | ICD-10-CM

## 2014-04-23 DIAGNOSIS — I2510 Atherosclerotic heart disease of native coronary artery without angina pectoris: Secondary | ICD-10-CM

## 2014-04-23 DIAGNOSIS — I10 Essential (primary) hypertension: Secondary | ICD-10-CM

## 2014-04-23 DIAGNOSIS — E782 Mixed hyperlipidemia: Secondary | ICD-10-CM

## 2014-04-23 NOTE — Assessment & Plan Note (Signed)
[facility]. She continues on Trilipix and Crestor.

## 2014-04-23 NOTE — Progress Notes (Signed)
Clinical Summary [name] is a 53 y.o.female last seen in September 2014 by [name]. She presents for routine visit. Reports no problems with her medications. She will be having followup lab work at the [facility] later this summer. She continues to work full-time. She has noticed some intermittent right-sided chest pains with emotional stress and also in the heat, nonexertional however like her prior angina symptoms.  ECG today is normal.  She does bruise easily, has had nosebleeds previously over the last year. We discussed stopping her Plavix at the end of August which would be a year after her DES, otherwise stay on aspirin.   Allergies  Allergen Reactions  . Ciprofloxacin Itching  . Demerol [Meperidine] Itching  . Tramadol Nausea And Vomiting    Current Outpatient Prescriptions  Medication Sig Dispense Refill  . aspirin 81 MG tablet Take 1 tablet (81 mg total) by mouth daily.      . calcium carbonate (CALCIUM 600) 600 MG TABS tablet Take 600 mg by mouth daily.      . Choline Fenofibrate (TRILIPIX) 135 MG capsule Take 135 mg by mouth daily.      . Cyanocobalamin (VITAMIN B-12 PO) Take 1 tablet by mouth daily.       . fish oil-omega-3 fatty acids 1000 MG capsule Take 2 g by mouth 2 (two) times daily.       . metoprolol (LOPRESSOR) 50 MG tablet Take 50 mg by mouth 2 (two) times daily.      . Multiple Vitamin (MULTIVITAMIN) tablet Take 1 tablet by mouth daily.      . nitroGLYCERIN (NITROSTAT) 0.4 MG SL tablet Place 1 tablet (0.4 mg total) under the tongue every 5 (five) minutes as needed for chest pain.  25 tablet  3  . Potassium 95 MG TABS Take 1 tablet by mouth daily.      . predniSONE (DELTASONE) 10 MG tablet Take 10 mg by mouth daily with breakfast.      . quinapril-hydrochlorothiazide (ACCURETIC) 10-12.5 MG per tablet Take 1 tablet by mouth daily.      . rosuvastatin (CRESTOR) 10 MG tablet Take 10 mg by mouth daily.       No current facility-administered  medications for this visit.    Past Medical History  Diagnosis Date  . GERD (gastroesophageal reflux disease)   . Essential hypertension   . Mixed hyperlipidemia   . Palpitations     Poossible PVCs based on description  . Coronary artery disease     a. 05/2013 Cath/PCI: LM nl, LAD min irregs, LCX 57m, OM1 large/nl, OM2 sm/nl, RCA 90p/74m (3.0x28 Promus Premier), EF 60%.  . Migraines   . Arthritis     Karen Roach reports that she has never smoked. She has never used smokeless tobacco. Karen Roach reports that she does not drink alcohol.  Review of Systems No palpitations, dizziness, syncope, claudication. Other systems reviewed and negative.  Physical Examination Filed Vitals:   04/23/14 1546  BP: 132/82  Pulse: 73   Filed Weights   04/23/14 1546  Weight: 196 lb (88.905 kg)    Appears comfortable at rest.  HEENT: Conjunctiva and lids normal, oropharynx clear.  Neck: Supple, no elevated JVP or carotid bruits, no thyromegaly.  Lungs: Clear to auscultation, nonlabored breathing at rest.  Cardiac: Regular rate and rhythm, no S3 or significant systolic murmur, no pericardial rub.  Abdomen: Soft, nontender, bowel sounds present, no guarding or rebound.  Extremities: Trace edema, distal pulses  2+.  Skin: Warm and dry.  Musculoskeletal: No kyphosis.  Neuropsychiatric: Alert and oriented x3, affect grossly appropriate.   Problem List and Plan   CAD (coronary artery disease) Plan to continue medical therapy at this point status post DES to the RCA in August 2014. She will complete Plavix at the end of August, continue aspirin, and otherwise no change to medical regimen. I have asked her to keep an eye out for any progressive symptoms, otherwise will see her back in 6 months.  Mixed hyperlipidemia She will have lab work through the [facility] later this summer. She continues on Trilipix and Crestor.  Essential hypertension, benign No change antihypertensive  regimen.    Karen Roach, M.D., F.A.C.C.

## 2014-04-23 NOTE — Assessment & Plan Note (Signed)
Plan to continue medical therapy at this point status post DES to the RCA in August 2014. She will complete Plavix at the end of August, continue aspirin, and otherwise no change to medical regimen. I have asked her to keep an eye out for any progressive symptoms, otherwise will see her back in 6 months.

## 2014-04-23 NOTE — Patient Instructions (Signed)
Your physician wants you to follow-up in: 6 months with Dr. Randa SpikeMcDowell You will receive a reminder letter in the mail two months in advance. If you don't receive a letter, please call our office to schedule the follow-up appointment.  Your physician recommends that you continue on your current medications as directed. Please refer to the Current Medication list given to you today.  PLEASE STOP PLAVIX AFTER FINISHING CURRENT BOTTLE  Thank you for choosing [facility]!!

## 2014-04-23 NOTE — Assessment & Plan Note (Signed)
No change antihypertensive regimen. 

## 2014-04-26 NOTE — Addendum Note (Signed)
Addended by: Thompson GrayerURHAM, Tarah Buboltz C on: 04/26/2014 11:58 AM   Modules accepted: Orders

## 2014-06-24 ENCOUNTER — Other Ambulatory Visit: Payer: Self-pay

## 2014-06-24 MED ORDER — ROSUVASTATIN CALCIUM 10 MG PO TABS: 10.0000 mg | ORAL_TABLET | Freq: Every day | ORAL | Status: AC

## 2014-06-24 MED ORDER — METOPROLOL TARTRATE 50 MG PO TABS: 50.0000 mg | ORAL_TABLET | Freq: Two times a day (BID) | ORAL | Status: AC

## 2014-06-24 MED ORDER — POTASSIUM 95 MG PO TABS: 1.0000 | ORAL_TABLET | Freq: Every day | ORAL | Status: AC

## 2014-06-24 MED ORDER — CHOLINE FENOFIBRATE 135 MG PO CPDR: 135.0000 mg | DELAYED_RELEASE_CAPSULE | Freq: Every day | ORAL | Status: AC

## 2014-06-24 MED ORDER — QUINAPRIL-HYDROCHLOROTHIAZIDE 10-12.5 MG PO TABS: 1.0000 | ORAL_TABLET | Freq: Every day | ORAL | Status: AC

## 2014-07-12 ENCOUNTER — Telehealth: Payer: Self-pay | Admitting: *Deleted

## 2014-07-12 NOTE — Telephone Encounter (Signed)
Pt states the Rx we called ion for her is different then what she was getting and cost to much, would like us to call in the original RX.Choline Fenofibrate (Capsule Delayed    To [facility] in [place], [place] [phone] 

## 2014-07-12 NOTE — Telephone Encounter (Signed)
chaged rx to tricor 145 mg daily  1 month rx with RF:0  Pt in [place] should establish MD there

## 2014-08-04 ENCOUNTER — Telehealth: Payer: Self-pay | Admitting: Cardiology

## 2014-08-04 NOTE — Telephone Encounter (Signed)
Will forward to Dr. Diona BrownerMcDowell. Unsure if we need to switch new medication since [place] is in [place] and TBE with cardiologist there.

## 2014-08-04 NOTE — Telephone Encounter (Signed)
[facility] [place] [address] Phone: [phone] and Fax: [phone].  Patient would like a call when this has been completed.

## 2014-08-05 MED ORDER — FENOFIBRATE 160 MG PO TABS: 160.0000 mg | ORAL_TABLET | Freq: Every day | ORAL | Status: AC

## 2014-08-05 NOTE — Telephone Encounter (Signed)
Pt made aware to establish with cardiologist for refills

## 2014-08-05 NOTE — Telephone Encounter (Signed)
Okay to make switch, but she does need to work on getting her prescriptions switched over to her new cardiologist.

## 2014-09-01 ENCOUNTER — Telehealth: Payer: Self-pay | Admitting: Adult Health

## 2014-09-01 NOTE — Telephone Encounter (Signed)
Number for pt not in service and [facility] [place]. Need to talk to pt for medications that need refilling

## 2014-09-01 NOTE — Telephone Encounter (Signed)
Patient has moved to [place] but has not been able to establish with Cardiologist.  Wants to know if we can do refills on meds one more time.  If so please send to [facility] in [place], [place].  Phone number is [phone]. / tgs  °

## 2014-09-03 ENCOUNTER — Other Ambulatory Visit: Payer: Self-pay | Admitting: Cardiology

## 2014-09-23 ENCOUNTER — Encounter (HOSPITAL_COMMUNITY): Payer: Self-pay | Admitting: Cardiovascular Disease
